# Patient Record
Sex: Female | Born: 1960 | Race: White | Hispanic: No | Marital: Married | Smoking: Never smoker
Health system: Southern US, Community
[De-identification: ages and names within clinical notes are randomized; demographics above are authoritative.]

---

## 1998-01-08 ENCOUNTER — Inpatient Hospital Stay (HOSPITAL_COMMUNITY): Admission: AD | Admit: 1998-01-08 | Discharge: 1998-01-12 | Payer: Self-pay | Admitting: Obstetrics & Gynecology

## 2000-02-10 ENCOUNTER — Other Ambulatory Visit: Admission: RE | Admit: 2000-02-10 | Discharge: 2000-02-10 | Payer: Self-pay | Admitting: Obstetrics & Gynecology

## 2003-06-19 ENCOUNTER — Other Ambulatory Visit: Admission: RE | Admit: 2003-06-19 | Discharge: 2003-06-19 | Payer: Self-pay | Admitting: Obstetrics and Gynecology

## 2008-11-26 ENCOUNTER — Encounter: Admission: RE | Admit: 2008-11-26 | Discharge: 2008-11-26 | Payer: Self-pay | Admitting: Obstetrics and Gynecology

## 2014-02-06 ENCOUNTER — Other Ambulatory Visit: Payer: Self-pay | Admitting: Obstetrics and Gynecology

## 2014-02-06 DIAGNOSIS — R928 Other abnormal and inconclusive findings on diagnostic imaging of breast: Secondary | ICD-10-CM

## 2014-02-15 ENCOUNTER — Ambulatory Visit
Admission: RE | Admit: 2014-02-15 | Discharge: 2014-02-15 | Disposition: A | Discharge status: Home / Self Care | Payer: PRIVATE HEALTH INSURANCE | Source: Ambulatory Visit | Attending: Obstetrics and Gynecology | Admitting: Obstetrics and Gynecology

## 2014-02-15 ENCOUNTER — Encounter (INDEPENDENT_AMBULATORY_CARE_PROVIDER_SITE_OTHER): Payer: Self-pay

## 2014-02-15 DIAGNOSIS — R928 Other abnormal and inconclusive findings on diagnostic imaging of breast: Secondary | ICD-10-CM

## 2021-04-08 ENCOUNTER — Encounter (INDEPENDENT_AMBULATORY_CARE_PROVIDER_SITE_OTHER): Payer: Self-pay | Admitting: Ophthalmology

## 2021-04-08 ENCOUNTER — Other Ambulatory Visit: Payer: Self-pay

## 2021-04-08 ENCOUNTER — Ambulatory Visit (INDEPENDENT_AMBULATORY_CARE_PROVIDER_SITE_OTHER): Payer: Self-pay | Admitting: Ophthalmology

## 2021-04-08 DIAGNOSIS — H35373 Puckering of macula, bilateral: Secondary | ICD-10-CM

## 2021-04-08 DIAGNOSIS — H25813 Combined forms of age-related cataract, bilateral: Secondary | ICD-10-CM

## 2021-04-08 DIAGNOSIS — H35342 Macular cyst, hole, or pseudohole, left eye: Secondary | ICD-10-CM

## 2021-04-08 DIAGNOSIS — H33322 Round hole, left eye: Secondary | ICD-10-CM

## 2021-04-08 DIAGNOSIS — H3581 Retinal edema: Secondary | ICD-10-CM

## 2021-04-08 MED ORDER — PREDNISOLONE ACETATE 1 % OP SUSP: 1.0000 [drp] | Freq: Four times a day (QID) | OPHTHALMIC | 0 refills | Status: AC

## 2021-04-08 NOTE — Progress Notes (Addendum)
[facility] - Clinic Note  04/08/2021     CHIEF COMPLAINT Patient presents for Retina Evaluation   HISTORY OF PRESENT ILLNESS: Lynn Gibbs is a 60 y.o. female who presents to the clinic today for:   HPI     Retina Evaluation   In left eye.  I, the attending physician,  performed the HPI with the patient and updated documentation appropriately.        Comments   Retina eval per Dr. Ronney Asters for retinal hole OS.  Patient had her yearly exam yesterday when this was noticed for the first time.  Patient denies new floaters (hx only a few that comes and goes), FOLs, vision appears stable.  She is myopic.  Her CLs Rx is -3.00 OD -2.50 OS.      Last edited by Rennis Chris, MD on 04/08/2021  4:15 PM.    Pt is here on the referral of Dr. Lorin Picket for concern of retinal hole OS, pt states he went to see him for a routine eye exam, she is not having any problems with her vision, no fol or floaters, pt saw Dr. Grayling Congress in January for an ERM in her right eye, pt denies being hypertensive or diabetic, she does not take any daily medications  Referring physician: Fredrich Birks, OD [address] [place],  [place] [address]  HISTORICAL INFORMATION:   Selected notes from the MEDICAL RECORD NUMBER Referred by Dr. Fredrich Birks for concern of retinal hole OS LEE:  Ocular Hx- PMH-    CURRENT MEDICATIONS: Current Outpatient Medications (Ophthalmic Drugs)  Medication Sig   prednisoLONE acetate (PRED FORTE) 1 % ophthalmic suspension Place 1 drop into the left eye 4 (four) times daily for 7 days.   No current facility-administered medications for this visit. (Ophthalmic Drugs)   No current outpatient medications on file. (Other)   No current facility-administered medications for this visit. (Other)   REVIEW OF SYSTEMS: ROS   Positive for: Eyes Negative for: Constitutional, Gastrointestinal, Neurological, Skin, Genitourinary, Musculoskeletal, HENT, Endocrine,  Cardiovascular, Respiratory, Psychiatric, Allergic/Imm, Heme/Lymph Last edited by Joni Reining, COA on 04/08/2021  1:44 PM.     ALLERGIES No Known Allergies  PAST MEDICAL HISTORY History reviewed. No pertinent past medical history. History reviewed. No pertinent surgical history.  FAMILY HISTORY Family History  Problem Relation Age of Onset   Amblyopia Neg Hx    Blindness Neg Hx    Cancer Neg Hx    Cataracts Neg Hx    Diabetes Neg Hx    Glaucoma Neg Hx    Hypertension Neg Hx    Macular degeneration Neg Hx    Retinal detachment Neg Hx    Strabismus Neg Hx    Stroke Neg Hx    Thyroid disease Neg Hx    Retinitis pigmentosa Neg Hx     SOCIAL HISTORY Social History   Tobacco Use   Smoking status: Never   Smokeless tobacco: Never       OPHTHALMIC EXAM: Base Eye Exam     Visual Acuity (Snellen - Linear)       Right Left   Dist cc 20/30 20/20 -2   Dist ph cc NI     Correction: Contacts         Tonometry (Tonopen, 2:00 PM)       Right Left   Pressure 18 18         Pupils       Dark Light Shape React  APD   Right 4 3 Round Brisk None   Left 4 3 Round Brisk None         Visual Fields (Counting fingers)       Left Right    Full Full         Extraocular Movement       Right Left    Full Full         Neuro/Psych     Oriented x3: Yes   Mood/Affect: Normal         Dilation     Both eyes: 1.0% Mydriacyl, 2.5% Phenylephrine @ 2:00 PM           Slit Lamp and Fundus Exam     External Exam       Right Left   External Normal Normal         Slit Lamp Exam       Right Left   Lids/Lashes Dermatochalasis - upper lid Dermatochalasis - upper lid   Conjunctiva/Sclera White and quiet White and quiet   Cornea arcus, 1+ fine Punctate epithelial erosions arcus, 1+ fine Punctate epithelial erosions   Anterior Chamber Deep and quiet Deep and quiet   Iris Round and dilated Round and dilated   Lens 2+ Nuclear sclerosis, 2+ Cortical  cataract 2+ Nuclear sclerosis, 2+ Cortical cataract   Vitreous Vitreous syneresis, Posterior vitreous detachment, mild vitreous condensations Vitreous syneresis, Posterior vitreous detachment         Fundus Exam       Right Left   Disc Pink and Sharp, Compact, mild tilt Pink and Sharp, Compact   C/D Ratio 0.1 0.2   Macula Flat, Blunted foveal reflex, ERM with central thickening and mild striae Flat, Blunted foveal reflex, ERM with central cystic changes   Vessels mild attenuation, mild tortuousity mild attenuation, mild tortuousity   Periphery Attached, No RT/RD Attached, operculated hole at 0200 with mild pigment surrounding, no SRF           Refraction     Wearing Rx       Sphere Cylinder Axis   Right -4.25 +1.00 005   Left -4.00 +1.50 175    Type: SVL         Manifest Refraction       Sphere Cylinder Axis Dist VA   Right -4.50 +1.00 020 NI   Left                IMAGING AND PROCEDURES  Imaging and Procedures for 04/08/2021  OCT, Retina - OU - Both Eyes       Right Eye Quality was good. Central Foveal Thickness: 478. Progression has no prior data. Findings include abnormal foveal contour, no IRF, no SRF, epiretinal membrane, myopic contour.   Left Eye Quality was good. Central Foveal Thickness: 375. Progression has no prior data. Findings include abnormal foveal contour, no SRF, intraretinal fluid, epiretinal membrane (ERM w/ central cystic changes and lamellar hole; flat retinal hole ST periphery -- caught on widefield).   Notes *Images captured and stored on drive  Diagnosis / Impression:  ERM OU OD: central thickening OS: ERM w/ central cystic changes and lamellar hole; flat retinal hole ST periphery -- caught on widefield  Clinical management:  See below  Abbreviations: NFP - Normal foveal profile. CME - cystoid macular edema. PED - pigment epithelial detachment. IRF - intraretinal fluid. SRF - subretinal fluid. EZ - ellipsoid zone. ERM -  epiretinal membrane. ORA - outer retinal atrophy. ORT -  outer retinal tubulation. SRHM - subretinal hyper-reflective material. IRHM - intraretinal hyper-reflective material      Repair Retinal Breaks, Laser - OS - Left Eye       LASER PROCEDURE NOTE  Procedure:  Barrier laser retinopexy using slit lamp laser, LEFT eye   Diagnosis:   Operculated retinal hole, LEFT eye                     0200 o'clock anterior to equator   Surgeon: Rennis Chris, MD, PhD  Anesthesia: Topical  Informed consent obtained, operative eye marked, and time out performed prior to initiation of laser.   Laser settings:  Lumenis Smart532 laser, slit lamp Lens: [place] PRP 165 Power: 300 mW Spot size: 200 microns Duration: 30 msec  # spots: 234  Placement of laser: Using a [place] PRP 165 contact lens at the slit lamp, laser was placed in three confluent rows around operculated hole at 2 oclock anterior to equator.  Complications: None.  Patient tolerated the procedure well and received written and verbal post-procedure care information/education.            ASSESSMENT/PLAN:    ICD-10-CM   1. Retinal hole of left eye  H33.322 Repair Retinal Breaks, Laser - OS - Left Eye    2. Epiretinal membrane (ERM) of both eyes  H35.373     3. Retinal edema  H35.81 OCT, Retina - OU - Both Eyes    4. Lamellar macular hole of left eye  H35.342     5. Combined forms of age-related cataract of both eyes  H25.813      1. Operculated retinal hole, OS   - The incidence, risk factors, and natural history of retinal tear was discussed with patient.   - Potential treatment options including laser retinopexy and cryotherapy discussed with patient. - operculated retinal hole located at 0200 -- no SRF, mild pigment surrounding - recommend laser retinopexy OS today, 11.02.22 - pt wishes to proceed with laser - RBA of procedure discussed, questions answered - informed consent obtained and signed - see procedure  note - start PF QID OS x7 days - f/u in 2-3 wks, DFE, OCT  2-4. Epiretinal membrane, both eyes   - OD w/ central retinal thickening  - OS w/ central cystic changes and lamellar macular hole - The natural history, anatomy, potential for loss of vision, and treatment options including vitrectomy techniques and the complications of endophthalmitis, retinal detachment, vitreous hemorrhage, cataract progression and permanent vision loss discussed with the patient. - BCVA 20/30 OD, 20/20 OS - asymptomatic, no metamorphopsia - no indication for surgery at this time - monitor for now - f/u 3 mos -- DFE/OCT  5. Mixed Cataract OU - The symptoms of cataract, surgical options, and treatments and risks were discussed with patient. - discussed diagnosis and progression - monitor   Ophthalmic Meds Ordered this visit:  Meds ordered this encounter  Medications   prednisoLONE acetate (PRED FORTE) 1 % ophthalmic suspension    Sig: Place 1 drop into the left eye 4 (four) times daily for 7 days.    Dispense:  10 mL    Refill:  0     Return for f/u 2-3 weeks, retinal tear OS, DFE, OCT.  There are no Patient Instructions on file for this visit.   Explained the diagnoses, plan, and follow up with the patient and they expressed understanding.  Patient expressed understanding of the importance of proper follow up care.  This document serves as a record of services personally performed by Karie Chimera, MD, PhD. It was created on their behalf by Glee Arvin. Manson Passey, OA an ophthalmic technician. The creation of this record is the provider's dictation and/or activities during the visit.    Electronically signed by: Glee Arvin. Manson Passey, [place] 11.02.2022 4:30 PM  Karie Chimera, M.D., Ph.D. Diseases & Surgery of the Retina and Vitreous [facility]  I have reviewed the above documentation for accuracy and completeness, and I agree with the above. Karie Chimera, M.D., Ph.D. 04/08/21 4:30  PM   Abbreviations: M myopia (nearsighted); A astigmatism; H hyperopia (farsighted); P presbyopia; Mrx spectacle prescription;  CTL contact lenses; OD right eye; OS left eye; OU both eyes  XT exotropia; ET esotropia; PEK punctate epithelial keratitis; PEE punctate epithelial erosions; DES dry eye syndrome; MGD meibomian gland dysfunction; ATs artificial tears; PFAT's preservative free artificial tears; NSC nuclear sclerotic cataract; PSC posterior subcapsular cataract; ERM epi-retinal membrane; PVD posterior vitreous detachment; RD retinal detachment; DM diabetes mellitus; DR diabetic retinopathy; NPDR non-proliferative diabetic retinopathy; PDR proliferative diabetic retinopathy; CSME clinically significant macular edema; DME diabetic macular edema; dbh dot blot hemorrhages; CWS cotton wool spot; POAG primary open angle glaucoma; C/D cup-to-disc ratio; HVF humphrey visual field; GVF goldmann visual field; OCT optical coherence tomography; IOP intraocular pressure; BRVO Branch retinal vein occlusion; CRVO central retinal vein occlusion; CRAO central retinal artery occlusion; BRAO branch retinal artery occlusion; RT retinal tear; SB scleral buckle; PPV pars plana vitrectomy; VH Vitreous hemorrhage; PRP panretinal laser photocoagulation; IVK intravitreal kenalog; VMT vitreomacular traction; MH Macular hole;  NVD neovascularization of the disc; NVE neovascularization elsewhere; AREDS age related eye disease study; ARMD age related macular degeneration; POAG primary open angle glaucoma; EBMD epithelial/anterior basement membrane dystrophy; ACIOL anterior chamber intraocular lens; IOL intraocular lens; PCIOL posterior chamber intraocular lens; Phaco/IOL phacoemulsification with intraocular lens placement; PRK photorefractive keratectomy; LASIK laser assisted in situ keratomileusis; HTN hypertension; DM diabetes mellitus; COPD chronic obstructive pulmonary disease

## 2021-04-21 NOTE — Progress Notes (Signed)
[facility] Clinic Note  04/24/2021     CHIEF COMPLAINT Patient presents for Retina Follow Up   HISTORY OF PRESENT ILLNESS: Lynn Gibbs is a 60 y.o. female who presents to the clinic today for:   HPI     Retina Follow Up   Patient presents with  Other.  In left eye.  Severity is mild.  Duration of 2.5 weeks.  Since onset it is stable.  I, the attending physician,  performed the HPI with the patient and updated documentation appropriately.        Comments   Pt here for 2 1/2 wk ret f/u for ret hole OS. S/P retinopexy for operculated hole OS. Pt states she is doing wonderfully. No changes in vision, floaters or FOL reported.       Last edited by Bernarda Caffey, MD on 04/25/2021 12:33 AM.     Pt states no problems after laser procedure, no new fol or floaters  Referring physician: Michael Boston, MD [place],  [place] [address]  HISTORICAL INFORMATION:   Selected notes from the MEDICAL RECORD NUMBER Referred by Dr. Macarthur Critchley for concern of retinal hole OS LEE:  Ocular Hx- PMH-    CURRENT MEDICATIONS: No current outpatient medications on file. (Ophthalmic Drugs)   No current facility-administered medications for this visit. (Ophthalmic Drugs)   No current outpatient medications on file. (Other)   No current facility-administered medications for this visit. (Other)   REVIEW OF SYSTEMS: ROS   Positive for: Eyes Negative for: Constitutional, Gastrointestinal, Neurological, Skin, Genitourinary, Musculoskeletal, HENT, Endocrine, Cardiovascular, Respiratory, Psychiatric, Allergic/Imm, Heme/Lymph Last edited by Kingsley Spittle, COT on 04/24/2021  9:41 AM.      ALLERGIES No Known Allergies  PAST MEDICAL HISTORY History reviewed. No pertinent past medical history. History reviewed. No pertinent surgical history.  FAMILY HISTORY Family History  Problem Relation Age of Onset   Amblyopia Neg Hx    Blindness Neg Hx     Cancer Neg Hx    Cataracts Neg Hx    Diabetes Neg Hx    Glaucoma Neg Hx    Hypertension Neg Hx    Macular degeneration Neg Hx    Retinal detachment Neg Hx    Strabismus Neg Hx    Stroke Neg Hx    Thyroid disease Neg Hx    Retinitis pigmentosa Neg Hx     SOCIAL HISTORY Social History   Tobacco Use   Smoking status: Never   Smokeless tobacco: Never  Substance Use Topics   Alcohol use: Not Currently       OPHTHALMIC EXAM: Base Eye Exam     Visual Acuity (Snellen - Linear)       Right Left   Dist cc 20/30 +2 20/20   Dist ph cc 20/25 -1     Correction: Contacts         Tonometry (Tonopen, 9:50 AM)       Right Left   Pressure 15 15         Pupils       Dark Light Shape React APD   Right 3 2 [place] Brisk None   Left 3 2 [place] Brisk None         Visual Fields       Left Right    Full Full         Extraocular Movement       Right Left    Full, Ortho Full,  Ortho         Neuro/Psych     Oriented x3: Yes   Mood/Affect: Normal           Slit Lamp and Fundus Exam     External Exam       Right Left   External Normal Normal         Slit Lamp Exam       Right Left   Lids/Lashes Dermatochalasis - upper lid Dermatochalasis - upper lid   Conjunctiva/Sclera White and quiet White and quiet   Cornea arcus, 1+ fine Punctate epithelial erosions arcus, 1+ fine Punctate epithelial erosions   Anterior Chamber Deep and quiet Deep and quiet   Iris Round and dilated Round and dilated   Lens 2+ Nuclear sclerosis, 2+ Cortical cataract 2+ Nuclear sclerosis, 2+ Cortical cataract   Anterior Vitreous Vitreous syneresis, Posterior vitreous detachment, mild vitreous condensations Vitreous syneresis, Posterior vitreous detachment         Fundus Exam       Right Left   Disc Pink and Sharp, Compact, mild tilt Pink and Sharp, Compact   C/D Ratio 0.1 0.2   Macula Flat, Blunted foveal reflex, ERM with central thickening and mild striae Flat, Blunted  foveal reflex, ERM with central cystic changes   Vessels mild attenuation, mild tortuousity mild attenuation, mild tortuousity   Periphery Attached, No RT/RD Attached, operculated hole at 0200 with mild pigment surrounding -- good laser changes surrounding, no new RT/RD           Refraction     Wearing Rx       Sphere   Right -3.00   Left -2.50            IMAGING AND PROCEDURES  Imaging and Procedures for 04/24/2021  OCT, Retina - OU - Both Eyes       Right Eye Quality was good. Central Foveal Thickness: 482. Progression has been stable. Findings include abnormal foveal contour, no IRF, no SRF, epiretinal membrane, myopic contour (ERM with central retinal thickening and mild pucker).   Left Eye Quality was good. Central Foveal Thickness: 393. Progression has been stable. Findings include abnormal foveal contour, no SRF, intraretinal fluid, epiretinal membrane (ERM w/ central cystic changes and lamellar hole; flat retinal hole ST periphery -- caught on widefield -- not imaged today).   Notes *Images captured and stored on drive  Diagnosis / Impression:  ERM OU OD: ERM w/ central thickening and mild pucker OS: ERM w/ central cystic changes and lamellar hole; flat retinal hole ST periphery -- caught on widefield - not imaged today  Clinical management:  See below  Abbreviations: NFP - Normal foveal profile. CME - cystoid macular edema. PED - pigment epithelial detachment. IRF - intraretinal fluid. SRF - subretinal fluid. EZ - ellipsoid zone. ERM - epiretinal membrane. ORA - outer retinal atrophy. ORT - outer retinal tubulation. SRHM - subretinal hyper-reflective material. IRHM - intraretinal hyper-reflective material             ASSESSMENT/PLAN:    ICD-10-CM   1. Retinal hole of left eye  H33.322     2. Epiretinal membrane (ERM) of both eyes  H35.373     3. Retinal edema  H35.81 OCT, Retina - OU - Both Eyes    4. Lamellar macular hole of left eye  H35.342      5. Combined forms of age-related cataract of both eyes  H25.813       1. Operculated retinal  hole, OS   - operculated retinal hole located at 0200 -- no SRF, mild pigment surrounding - s/p laser retinopexy OS (11.02.22) -- excellent laser in place surrounding hole - f/u in 3 months, DFE, OCT  2-4. Epiretinal membrane, both eyes   - OD w/ central retinal thickening  - OS w/ central cystic changes and lamellar macular hole - BCVA 20/25 OD, 20/20 OS - asymptomatic, no metamorphopsia - no indication for surgery at this time - monitor for now - f/u 3 mos -- DFE/OCT  5. Mixed Cataract OU - The symptoms of cataract, surgical options, and treatments and risks were discussed with patient. - discussed diagnosis and progression - monitor    Ophthalmic Meds Ordered this visit:  No orders of the defined types were placed in this encounter.    Return in about 3 months (around 07/25/2021) for f/u retinal hole OS / ERM OU, DFE, OCT.  There are no Patient Instructions on file for this visit.   Explained the diagnoses, plan, and follow up with the patient and they expressed understanding.  Patient expressed understanding of the importance of proper follow up care.   This document serves as a record of services personally performed by Gardiner Sleeper, MD, PhD. It was created on their behalf by Leonie Douglas, an ophthalmic technician. The creation of this record is the provider's dictation and/or activities during the visit.    Electronically signed by: Leonie Douglas COA, 04/25/21  12:47 AM  This document serves as a record of services personally performed by Gardiner Sleeper, MD, PhD. It was created on their behalf by San Jetty. Owens Shark, OA an ophthalmic technician. The creation of this record is the provider's dictation and/or activities during the visit.    Electronically signed by: San Jetty. Marguerita Merles 11.18.2022 12:47 AM  Gardiner Sleeper, M.D., Ph.D. Diseases & Surgery of the Retina and  [place] 04/24/2021  I have reviewed the above documentation for accuracy and completeness, and I agree with the above. Gardiner Sleeper, M.D., Ph.D. 04/25/21 12:47 AM  Abbreviations: M myopia (nearsighted); A astigmatism; H hyperopia (farsighted); P presbyopia; Mrx spectacle prescription;  CTL contact lenses; OD right eye; OS left eye; OU both eyes  XT exotropia; ET esotropia; PEK punctate epithelial keratitis; PEE punctate epithelial erosions; DES dry eye syndrome; MGD meibomian gland dysfunction; ATs artificial tears; PFAT's preservative free artificial tears; [place] nuclear sclerotic cataract; PSC posterior subcapsular cataract; ERM epi-retinal membrane; PVD posterior vitreous detachment; RD retinal detachment; DM diabetes mellitus; DR diabetic retinopathy; NPDR non-proliferative diabetic retinopathy; PDR proliferative diabetic retinopathy; CSME clinically significant macular edema; DME diabetic macular edema; dbh dot blot hemorrhages; CWS cotton wool spot; POAG primary open angle glaucoma; C/D cup-to-disc ratio; HVF humphrey visual field; GVF goldmann visual field; OCT optical coherence tomography; IOP intraocular pressure; BRVO Branch retinal vein occlusion; CRVO central retinal vein occlusion; CRAO central retinal artery occlusion; BRAO branch retinal artery occlusion; RT retinal tear; SB scleral buckle; PPV pars plana vitrectomy; VH Vitreous hemorrhage; PRP panretinal laser photocoagulation; IVK intravitreal kenalog; VMT vitreomacular traction; MH Macular hole;  NVD neovascularization of the disc; NVE neovascularization elsewhere; AREDS age related eye disease study; ARMD age related macular degeneration; POAG primary open angle glaucoma; EBMD epithelial/anterior basement membrane dystrophy; ACIOL anterior chamber intraocular lens; IOL intraocular lens; PCIOL posterior chamber intraocular lens; Phaco/IOL phacoemulsification with intraocular lens placement; [place]  photorefractive keratectomy; LASIK laser assisted in situ keratomileusis; HTN hypertension; DM diabetes mellitus; COPD chronic obstructive  pulmonary disease

## 2021-04-24 ENCOUNTER — Ambulatory Visit (INDEPENDENT_AMBULATORY_CARE_PROVIDER_SITE_OTHER): Payer: Self-pay | Admitting: Ophthalmology

## 2021-04-24 ENCOUNTER — Other Ambulatory Visit: Payer: Self-pay

## 2021-04-24 ENCOUNTER — Encounter (INDEPENDENT_AMBULATORY_CARE_PROVIDER_SITE_OTHER): Payer: Self-pay | Admitting: Ophthalmology

## 2021-04-24 DIAGNOSIS — H3581 Retinal edema: Secondary | ICD-10-CM

## 2021-04-24 DIAGNOSIS — H35373 Puckering of macula, bilateral: Secondary | ICD-10-CM

## 2021-04-24 DIAGNOSIS — H35342 Macular cyst, hole, or pseudohole, left eye: Secondary | ICD-10-CM

## 2021-04-24 DIAGNOSIS — H33322 Round hole, left eye: Secondary | ICD-10-CM | POA: Diagnosis not present

## 2021-04-24 DIAGNOSIS — H25813 Combined forms of age-related cataract, bilateral: Secondary | ICD-10-CM | POA: Diagnosis not present

## 2021-04-25 ENCOUNTER — Encounter (INDEPENDENT_AMBULATORY_CARE_PROVIDER_SITE_OTHER): Payer: Self-pay | Admitting: Ophthalmology

## 2021-04-29 ENCOUNTER — Encounter (INDEPENDENT_AMBULATORY_CARE_PROVIDER_SITE_OTHER): Payer: PRIVATE HEALTH INSURANCE | Admitting: Ophthalmology

## 2021-07-27 ENCOUNTER — Encounter (INDEPENDENT_AMBULATORY_CARE_PROVIDER_SITE_OTHER): Payer: PRIVATE HEALTH INSURANCE | Admitting: Ophthalmology

## 2021-08-21 NOTE — Progress Notes (Signed)
?[facility] - Clinic Note ? ?08/25/2021 ? ?  ? ?CHIEF COMPLAINT ?Patient presents for Retina Follow Up ? ?HISTORY OF PRESENT ILLNESS: ?Lynn Gibbs is a 61 y.o. female who presents to the clinic today for:  ? ?HPI   ? ? Retina Follow Up   ?Patient presents with  Other.  In both eyes.  Duration of 4 months.  Since onset it is stable.  I, the attending physician,  performed the HPI with the patient and updated documentation appropriately. ? ?  ?  ? ? Comments   ?4 1/2 month follow up ERM OU- Doing well, no changes or new problems.  ? ?  ?  ?Last edited by Rennis Chris, MD on 08/26/2021 10:05 PM.  ?  ?Pt states no problems after laser procedure, no new fol or floaters ? ?Referring physician: ?Melida Quitter, MD ?[address] ?[place],  [place] [address] ? ?HISTORICAL INFORMATION:  ? ?Selected notes from the MEDICAL RECORD NUMBER ?Referred by Dr. Fredrich Birks for concern of retinal hole OS ?LEE:  ?Ocular Hx- ?PMH- ?  ? ?CURRENT MEDICATIONS: ?No current outpatient medications on file. (Ophthalmic Drugs)  ? ?No current facility-administered medications for this visit. (Ophthalmic Drugs)  ? ?No current outpatient medications on file. (Other)  ? ?No current facility-administered medications for this visit. (Other)  ? ?REVIEW OF SYSTEMS: ?ROS   ?Positive for: Eyes ?Negative for: Constitutional, Gastrointestinal, Neurological, Skin, Genitourinary, Musculoskeletal, HENT, Endocrine, Cardiovascular, Respiratory, Psychiatric, Allergic/Imm, Heme/Lymph ?Last edited by Joni Reining, COA on 08/25/2021  9:37 AM.  ?  ? ?ALLERGIES ?No Known Allergies ? ?PAST MEDICAL HISTORY ?History reviewed. No pertinent past medical history. ?History reviewed. No pertinent surgical history. ? ?FAMILY HISTORY ?Family History  ?Problem Relation Age of Onset  ? Amblyopia Neg Hx   ? Blindness Neg Hx   ? Cancer Neg Hx   ? Cataracts Neg Hx   ? Diabetes Neg Hx   ? Glaucoma Neg Hx   ? Hypertension Neg Hx   ? Macular degeneration Neg Hx   ?  Retinal detachment Neg Hx   ? Strabismus Neg Hx   ? Stroke Neg Hx   ? Thyroid disease Neg Hx   ? Retinitis pigmentosa Neg Hx   ? ?SOCIAL HISTORY ?Social History  ? ?Tobacco Use  ? Smoking status: Never  ? Smokeless tobacco: Never  ?Substance Use Topics  ? Alcohol use: Not Currently  ?  ? ?  ?OPHTHALMIC EXAM: ?Base Eye Exam   ? ? Visual Acuity (Snellen - Linear)   ? ?   Right Left  ? Dist cc 20/30 +1 20/20  ? Dist ph cc NI   ? ? Correction: Glasses  ? ?  ?  ? ? Tonometry (Tonopen, 9:43 AM)   ? ?   Right Left  ? Pressure 20 20  ? ?  ?  ? ? Pupils   ? ?   Dark Light Shape React APD  ? Right 4 3 Round Brisk None  ? Left 4 3 Round Brisk None  ? ?  ?  ? ? Visual Fields (Counting fingers)   ? ?   Left Right  ?  Full Full  ? ?  ?  ? ? Extraocular Movement   ? ?   Right Left  ?  Full Full  ? ?  ?  ? ? Neuro/Psych   ? ? Oriented x3: Yes  ? Mood/Affect: Normal  ? ?  ?  ? ?  Dilation   ? ? Both eyes: 1.0% Mydriacyl, 2.5% Phenylephrine @ 9:43 AM  ? ?  ?  ? ?  ? ?Slit Lamp and Fundus Exam   ? ? External Exam   ? ?   Right Left  ? External Normal Normal  ? ?  ?  ? ? Slit Lamp Exam   ? ?   Right Left  ? Lids/Lashes Dermatochalasis - upper lid Dermatochalasis - upper lid  ? Conjunctiva/Sclera White and quiet White and quiet  ? Cornea arcus, 1+ fine Punctate epithelial erosions arcus, 1+ fine Punctate epithelial erosions  ? Anterior Chamber Deep and quiet Deep and quiet  ? Iris Round and dilated Round and dilated  ? Lens 2-3+ Nuclear sclerosis, 2-3+ Cortical cataract 2-3+ Nuclear sclerosis, 2-3+ Cortical cataract  ? Anterior Vitreous Vitreous syneresis, Posterior vitreous detachment, mild vitreous condensations Vitreous syneresis, Posterior vitreous detachment, Vitreous condensations  ? ?  ?  ? ? Fundus Exam   ? ?   Right Left  ? Disc Pink and Sharp, Compact, mild tilt Pink and Sharp, Compact  ? C/D Ratio 0.3 0.2  ? Macula Flat, Blunted foveal reflex, ERM with central thickening and mild striae, mild RPE mottling Flat, Blunted foveal  reflex, ERM with central cystic changes  ? Vessels mild attenuation, mild tortuousity mild attenuation, mild tortuousity  ? Periphery Attached, No heme, No RT/RD, mild focal Reticular degeneration IT arcade Attached, operculated hole at 0200 with mild pigment surrounding -- good laser changes surrounding, no new RT/RD  ? ?  ?  ? ?  ? ?Refraction   ? ? Wearing Rx   ? ?   Sphere Cylinder Axis  ? Right -4.25 +1.00 005  ? Left -4.00 +1.50 175  ? ? Type: SVL  ? ?  ?  ? ?  ? ?IMAGING AND PROCEDURES  ?Imaging and Procedures for 08/25/2021 ? ?OCT, Retina - OU - Both Eyes   ? ?   ?Right Eye ?Quality was good. Central Foveal Thickness: 478. Progression has been stable. Findings include abnormal foveal contour, no IRF, no SRF, epiretinal membrane, myopic contour (ERM with central retinal thickening and mild pucker -- stable).  ? ?Left Eye ?Quality was good. Central Foveal Thickness: 375. Progression has been stable. Findings include abnormal foveal contour, no SRF, intraretinal fluid, epiretinal membrane (ERM w/ central cystic changes and lamellar hole; flat retinal hole ST periphery -- caught on widefield -- stable with good laser surrounding).  ? ?Notes ?*Images captured and stored on drive ? ?Diagnosis / Impression:  ?ERM OU ?OD: ERM w/ central thickening and mild pucker ?OS: ERM w/ central cystic changes and lamellar hole; flat retinal hole ST periphery -- caught on widefield -- stable with good laser surrounding ? ?Clinical management:  ?See below ? ?Abbreviations: NFP - Normal foveal profile. CME - cystoid macular edema. PED - pigment epithelial detachment. IRF - intraretinal fluid. SRF - subretinal fluid. EZ - ellipsoid zone. ERM - epiretinal membrane. ORA - outer retinal atrophy. ORT - outer retinal tubulation. SRHM - subretinal hyper-reflective material. IRHM - intraretinal hyper-reflective material ? ? ?  ?  ?  ? ?  ?ASSESSMENT/PLAN: ? ?  ICD-10-CM   ?1. 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  ?2. Epiretinal membrane  (ERM) of both eyes  H35.373 OCT, Retina - OU - Both Eyes  ?  ?3. Lamellar macular hole of left eye  H35.342   ?  ?4. Combined forms of age-related cataract of both  eyes  H25.813   ?  ? ?1. Operculated retinal hole, OS   ?- operculated retinal hole located at 0200 -- no SRF, mild pigment surrounding ?- s/p laser retinopexy OS (11.02.22) -- excellent laser in place surrounding hole  ?- f/u in 6 months, DFE, OCT ? ?2,3. Epiretinal membrane, both eyes  ? - OD w/ central retinal thickening ? - OS w/ central cystic changes and lamellar macular hole ?- BCVA 20/25 OD, 20/20 OS ?- asymptomatic, no metamorphopsia ?- no indication for surgery at this time ?- monitor for now ?- f/u 6 mos -- DFE/OCT ? ?4. Mixed Cataract OU ?- The symptoms of cataract, surgical options, and treatments and risks were discussed with patient. ?- discussed diagnosis and progression ?- monitor  ? ?Ophthalmic Meds Ordered this visit:  ?No orders of the defined types were placed in this encounter. ?  ? ?Return in about 6 months (around 02/25/2022) for DFE, OCT. ? ?There are no Patient Instructions on file for this visit. ? ? ?Explained the diagnoses, plan, and follow up with the patient and they expressed understanding.  Patient expressed understanding of the importance of proper follow up care.  ? ?This document serves as a record of services personally performed by Karie Chimera, MD, PhD. It was created on their behalf by Joni Reining, an ophthalmic technician. The creation of this record is the provider's dictation and/or activities during the visit.   ? ?Electronically signed by: Joni Reining COA, 08/26/21  10:07 PM ? ?Karie Chimera, M.D., Ph.D. ?Diseases & Surgery of the Retina and Vitreous ?[facility] ? ?I have reviewed the above documentation for accuracy and completeness, and I agree with the above. Karie Chimera, M.D., Ph.D. 08/26/21 10:12 PM ? ?Abbreviations: ?M myopia (nearsighted); A astigmatism; H hyperopia  (farsighted); P presbyopia; Mrx spectacle prescription;  CTL contact lenses; OD right eye; OS left eye; OU both eyes  XT exotropia; ET esotropia; PEK punctate epithelial keratitis; PEE punctate epithelial eros

## 2021-08-25 ENCOUNTER — Other Ambulatory Visit: Payer: Self-pay

## 2021-08-25 ENCOUNTER — Encounter (INDEPENDENT_AMBULATORY_CARE_PROVIDER_SITE_OTHER): Payer: Self-pay | Admitting: Ophthalmology

## 2021-08-25 ENCOUNTER — Ambulatory Visit (INDEPENDENT_AMBULATORY_CARE_PROVIDER_SITE_OTHER): Payer: Self-pay | Admitting: Ophthalmology

## 2021-08-25 DIAGNOSIS — H35373 Puckering of macula, bilateral: Secondary | ICD-10-CM | POA: Diagnosis not present

## 2021-08-25 DIAGNOSIS — H33322 Round hole, left eye: Secondary | ICD-10-CM | POA: Diagnosis not present

## 2021-08-25 DIAGNOSIS — H35342 Macular cyst, hole, or pseudohole, left eye: Secondary | ICD-10-CM | POA: Diagnosis not present

## 2021-08-25 DIAGNOSIS — H25813 Combined forms of age-related cataract, bilateral: Secondary | ICD-10-CM

## 2021-08-26 ENCOUNTER — Encounter (INDEPENDENT_AMBULATORY_CARE_PROVIDER_SITE_OTHER): Payer: Self-pay | Admitting: Ophthalmology

## 2021-10-01 ENCOUNTER — Other Ambulatory Visit: Payer: Self-pay | Admitting: Internal Medicine

## 2021-10-01 DIAGNOSIS — M85859 Other specified disorders of bone density and structure, unspecified thigh: Secondary | ICD-10-CM

## 2021-10-05 ENCOUNTER — Other Ambulatory Visit: Payer: Self-pay | Admitting: Internal Medicine

## 2021-10-05 DIAGNOSIS — Z1231 Encounter for screening mammogram for malignant neoplasm of breast: Secondary | ICD-10-CM

## 2021-11-23 ENCOUNTER — Ambulatory Visit
Admission: RE | Admit: 2021-11-23 | Discharge: 2021-11-23 | Disposition: A | Discharge status: Home / Self Care | Payer: No Typology Code available for payment source | Source: Ambulatory Visit | Attending: Internal Medicine | Admitting: Internal Medicine

## 2021-11-23 ENCOUNTER — Ambulatory Visit
Admission: RE | Admit: 2021-11-23 | Discharge: 2021-11-23 | Disposition: A | Discharge status: Home / Self Care | Payer: Self-pay | Source: Ambulatory Visit | Attending: Internal Medicine | Admitting: Internal Medicine

## 2021-11-23 ENCOUNTER — Other Ambulatory Visit: Payer: Self-pay | Admitting: Internal Medicine

## 2021-11-23 DIAGNOSIS — Z1231 Encounter for screening mammogram for malignant neoplasm of breast: Secondary | ICD-10-CM

## 2021-11-23 DIAGNOSIS — M85859 Other specified disorders of bone density and structure, unspecified thigh: Secondary | ICD-10-CM

## 2021-11-25 ENCOUNTER — Other Ambulatory Visit: Payer: PRIVATE HEALTH INSURANCE

## 2022-02-18 NOTE — Progress Notes (Signed)
[facility] Clinic Note  02/22/2022     CHIEF COMPLAINT Patient presents for Retina Follow Up  HISTORY OF PRESENT ILLNESS: Lynn Gibbs is a 61 y.o. female who presents to the clinic today for:   HPI     Retina Follow Up   Patient presents with  Other.  In left eye.  This started 6 months ago.  I, the attending physician,  performed the HPI with the patient and updated documentation appropriately.        Comments   Patient here for 6 month retina follow up for retina hole OS. Patient states vision doing the same. No eye pain.      Last edited by Bernarda Caffey, MD on 02/22/2022 10:06 PM.     Pt states VA is stable.   Referring physician: Michael Boston, MD [place],  [place] [address]  HISTORICAL INFORMATION:   Selected notes from the MEDICAL RECORD NUMBER Referred by Dr. Macarthur Critchley for concern of retinal hole OS LEE:  Ocular Hx- PMH-    CURRENT MEDICATIONS: No current outpatient medications on file. (Ophthalmic Drugs)   No current facility-administered medications for this visit. (Ophthalmic Drugs)   No current outpatient medications on file. (Other)   No current facility-administered medications for this visit. (Other)   REVIEW OF SYSTEMS: ROS   Positive for: Eyes Negative for: Constitutional, Gastrointestinal, Neurological, Skin, Genitourinary, Musculoskeletal, HENT, Endocrine, Cardiovascular, Respiratory, Psychiatric, Allergic/Imm, Heme/Lymph Last edited by Theodore Demark, COA on 02/22/2022  9:54 AM.     ALLERGIES No Known Allergies  PAST MEDICAL HISTORY History reviewed. No pertinent past medical history. History reviewed. No pertinent surgical history.  FAMILY HISTORY Family History  Problem Relation Age of Onset   Amblyopia Neg Hx    Blindness Neg Hx    Cancer Neg Hx    Cataracts Neg Hx    Diabetes Neg Hx    Glaucoma Neg Hx    Hypertension Neg Hx    Macular degeneration Neg Hx    Retinal detachment Neg  Hx    Strabismus Neg Hx    Stroke Neg Hx    Thyroid disease Neg Hx    Retinitis pigmentosa Neg Hx    SOCIAL HISTORY Social History   Tobacco Use   Smoking status: Never   Smokeless tobacco: Never  Vaping Use   Vaping Use: Never used  Substance Use Topics   Alcohol use: Not Currently       OPHTHALMIC EXAM: Base Eye Exam     Visual Acuity (Snellen - Linear)       Right Left   Dist cc 20/30 -1 20/20   Dist ph cc 20/25 -2     Correction: Glasses         Tonometry (Tonopen, 9:51 AM)       Right Left   Pressure 16 16         Pupils       Dark Light Shape React APD   Right 4 3 Round Brisk None   Left 4 3 Round Brisk None         Visual Fields (Counting fingers)       Left Right    Full Full         Extraocular Movement       Right Left    Full, Ortho Full, Ortho         Neuro/Psych     Oriented x3: Yes  Mood/Affect: Normal         Dilation     Both eyes: 1.0% Mydriacyl, 2.5% Phenylephrine @ 9:49 AM           Slit Lamp and Fundus Exam     External Exam       Right Left   External Normal Normal         Slit Lamp Exam       Right Left   Lids/Lashes Dermatochalasis - upper lid Dermatochalasis - upper lid   Conjunctiva/Sclera White and quiet White and quiet   Cornea arcus, 2-3+ fine Punctate epithelial erosions arcus, 1-2+ fine Punctate epithelial erosions   Anterior Chamber Deep and quiet Deep and quiet   Iris Round and dilated Round and dilated   Lens 2-3+ Nuclear sclerosis, 2+ Cortical cataract 2-3+ Nuclear sclerosis, 2+ Cortical cataract, trace PSC   Anterior Vitreous Vitreous syneresis, Posterior vitreous detachment, mild vitreous condensations Vitreous syneresis, Posterior vitreous detachment, Vitreous condensations         Fundus Exam       Right Left   Disc Pink and Sharp, Compact, mild tilt, temporal PPA Pink and Sharp, Compact, mild temporal PPA   C/D Ratio 0.3 0.2   Macula Flat, Blunted foveal reflex, ERM  with central thickening and mild striae, mild RPE mottling Flat, Blunted foveal reflex, ERM with central cystic changes   Vessels mild attenuation, mild tortuousity mild attenuation, mild tortuousity   Periphery Attached, No heme, No RT/RD, mild Reticular degeneration Attached, operculated hole at 0200 with mild pigment surrounding -- good laser changes surrounding, mild inferior pavingstone, scattered mild reticular degeneration, no new RT/RD           Refraction     Wearing Rx       Sphere Cylinder Axis   Right -4.25 +1.00 005   Left -4.00 +1.50 175    Type: SVL           IMAGING AND PROCEDURES  Imaging and Procedures for 02/22/2022  OCT, Retina - OU - Both Eyes       Right Eye Quality was good. Central Foveal Thickness: 503. Progression has worsened. Findings include no IRF, no SRF, abnormal foveal contour, myopic contour, epiretinal membrane (ERM with mild pucker and mild interval increase in central retinal thickening/blunting of foveal contour).   Left Eye Quality was good. Central Foveal Thickness: 380. Progression has been stable. Findings include no SRF, abnormal foveal contour, epiretinal membrane, intraretinal fluid (ERM w/ central cystic changes and lamellar hole).   Notes *Images captured and stored on drive  Diagnosis / Impression:  ERM OU OD: ERM with mild pucker and mild interval increase in central retinal thickening/blunting of foveal contour OS: ERM w/ central cystic changes and lamellar hole  Clinical management:  See below  Abbreviations: NFP - Normal foveal profile. CME - cystoid macular edema. PED - pigment epithelial detachment. IRF - intraretinal fluid. SRF - subretinal fluid. EZ - ellipsoid zone. ERM - epiretinal membrane. ORA - outer retinal atrophy. ORT - outer retinal tubulation. SRHM - subretinal hyper-reflective material. IRHM - intraretinal hyper-reflective material            ASSESSMENT/PLAN:    ICD-10-CM   1. Retinal hole of  left eye  H33.322     2. Epiretinal membrane (ERM) of both eyes  H35.373 OCT, Retina - OU - Both Eyes    3. Lamellar macular hole of left eye  H35.342 OCT, Retina - OU - Both Eyes  4. Combined forms of age-related cataract of both eyes  H25.813       1. Operculated retinal hole, OS   - operculated retinal hole located at 0200 -- no SRF, mild pigment surrounding - s/p laser retinopexy OS (11.02.22) -- excellent laser in place surrounding hole  - no new RT/RD - f/u in 6 months, DFE, OCT  2,3. Epiretinal membrane, both eyes   - OD w/ central retinal thickening  - OS w/ central cystic changes and lamellar macular hole - BCVA 20/25 OD, 20/20 OS -- stable - asymptomatic, no metamorphopsia - no indication for surgery at this time - monitor for now - f/u 6 months DFE, OCT  4. Mixed Cataract OU - The symptoms of cataract, surgical options, and treatments and risks were discussed with patient. - discussed diagnosis and progression - monitor  Ophthalmic Meds Ordered this visit:  No orders of the defined types were placed in this encounter.    Return in about 6 months (around 08/23/2022) for ERM OD, ret hole OS, DFE, OCT .  There are no Patient Instructions on file for this visit.   Explained the diagnoses, plan, and follow up with the patient and they expressed understanding.  Patient expressed understanding of the importance of proper follow up care.   This document serves as a record of services personally performed by Gardiner Sleeper, MD, PhD. It was created on their behalf by Roselee Nova, COMT. The creation of this record is the provider's dictation and/or activities during the visit.  Electronically signed by: Roselee Nova, COMT 02/22/22 10:08 PM  This document serves as a record of services personally performed by Gardiner Sleeper, MD, PhD. It was created on their behalf by Orvan Falconer, an ophthalmic technician. The creation of this record is the provider's dictation  and/or activities during the visit.    Electronically signed by: Orvan Falconer, OA, 02/22/22  10:08 PM  Gardiner Sleeper, M.D., Ph.D. Diseases & Surgery of the Retina and Vitreous [facility]  I have reviewed the above documentation for accuracy and completeness, and I agree with the above. Gardiner Sleeper, M.D., Ph.D. 02/22/22 10:10 PM   Abbreviations: M myopia (nearsighted); A astigmatism; H hyperopia (farsighted); P presbyopia; Mrx spectacle prescription;  CTL contact lenses; OD right eye; OS left eye; OU both eyes  XT exotropia; ET esotropia; PEK punctate epithelial keratitis; PEE punctate epithelial erosions; DES dry eye syndrome; MGD meibomian gland dysfunction; ATs artificial tears; PFAT's preservative free artificial tears; [place] nuclear sclerotic cataract; PSC posterior subcapsular cataract; ERM epi-retinal membrane; PVD posterior vitreous detachment; RD retinal detachment; DM diabetes mellitus; DR diabetic retinopathy; NPDR non-proliferative diabetic retinopathy; PDR proliferative diabetic retinopathy; CSME clinically significant macular edema; DME diabetic macular edema; dbh dot blot hemorrhages; CWS cotton wool spot; POAG primary open angle glaucoma; C/D cup-to-disc ratio; HVF humphrey visual field; GVF goldmann visual field; OCT optical coherence tomography; IOP intraocular pressure; BRVO Branch retinal vein occlusion; CRVO central retinal vein occlusion; CRAO central retinal artery occlusion; BRAO branch retinal artery occlusion; RT retinal tear; SB scleral buckle; PPV pars plana vitrectomy; VH Vitreous hemorrhage; PRP panretinal laser photocoagulation; IVK intravitreal kenalog; VMT vitreomacular traction; MH Macular hole;  NVD neovascularization of the disc; NVE neovascularization elsewhere; AREDS age related eye disease study; ARMD age related macular degeneration; POAG primary open angle glaucoma; EBMD epithelial/anterior basement membrane dystrophy; ACIOL anterior  chamber intraocular lens; IOL intraocular lens; PCIOL posterior chamber intraocular lens; Phaco/IOL phacoemulsification with intraocular lens placement;  [place] photorefractive keratectomy; LASIK laser assisted in situ keratomileusis; HTN hypertension; DM diabetes mellitus; COPD chronic obstructive pulmonary disease

## 2022-02-22 ENCOUNTER — Encounter (INDEPENDENT_AMBULATORY_CARE_PROVIDER_SITE_OTHER): Payer: Self-pay | Admitting: Ophthalmology

## 2022-02-22 ENCOUNTER — Ambulatory Visit (INDEPENDENT_AMBULATORY_CARE_PROVIDER_SITE_OTHER): Payer: Self-pay | Admitting: Ophthalmology

## 2022-02-22 DIAGNOSIS — H25813 Combined forms of age-related cataract, bilateral: Secondary | ICD-10-CM

## 2022-02-22 DIAGNOSIS — H35373 Puckering of macula, bilateral: Secondary | ICD-10-CM

## 2022-02-22 DIAGNOSIS — H35342 Macular cyst, hole, or pseudohole, left eye: Secondary | ICD-10-CM

## 2022-02-22 DIAGNOSIS — H33322 Round hole, left eye: Secondary | ICD-10-CM

## 2022-08-11 NOTE — Progress Notes (Signed)
[facility]  08/23/2022     CHIEF COMPLAINT Patient presents for Retina Follow Up  HISTORY OF PRESENT ILLNESS: Lynn Gibbs is a 62 y.o. female who presents to the clinic today for:   HPI     Retina Follow Up   Patient presents with  Other.  In left eye.  This started years ago.  Duration of 6 months.  Since onset it is stable.  I, the attending physician,  performed the HPI with the patient and updated documentation appropriately.        Comments   Patient feels that the vision is the same since her last visit 6 months ago. She is not using any eye drops at this time.       Last edited by Bernarda Caffey, MD on 08/25/2022  1:07 AM.     Patient feels that the vision is the same.   Referring physician: Michael Boston, MD [place],  [place] [address]  HISTORICAL INFORMATION:   Selected notes from the MEDICAL RECORD NUMBER Referred by Dr. Macarthur Critchley for concern of retinal hole OS LEE:  Ocular Hx- PMH-    CURRENT MEDICATIONS: No current outpatient medications on file. (Ophthalmic Drugs)   No current facility-administered medications for this visit. (Ophthalmic Drugs)   No current outpatient medications on file. (Other)   No current facility-administered medications for this visit. (Other)   REVIEW OF SYSTEMS: ROS   Positive for: Eyes Negative for: Constitutional, Gastrointestinal, Neurological, Skin, Genitourinary, Musculoskeletal, HENT, Endocrine, Cardiovascular, Respiratory, Psychiatric, Allergic/Imm, Heme/Lymph Last edited by Annie Paras, COT on 08/23/2022  9:56 AM.      ALLERGIES No Known Allergies  PAST MEDICAL HISTORY History reviewed. No pertinent past medical history. History reviewed. No pertinent surgical history.  FAMILY HISTORY Family History  Problem Relation Age of Onset   Amblyopia Neg Hx    Blindness Neg Hx    Cancer Neg Hx    Cataracts Neg Hx    Diabetes Neg Hx    Glaucoma Neg Hx     Hypertension Neg Hx    Macular degeneration Neg Hx    Retinal detachment Neg Hx    Strabismus Neg Hx    Stroke Neg Hx    Thyroid disease Neg Hx    Retinitis pigmentosa Neg Hx    SOCIAL HISTORY Social History   Tobacco Use   Smoking status: Never   Smokeless tobacco: Never  Vaping Use   Vaping Use: Never used  Substance Use Topics   Alcohol use: Not Currently       OPHTHALMIC EXAM: Base Eye Exam     Visual Acuity (Snellen - Linear)       Right Left   Dist cc 20/40 +2 20/20   Dist ph cc 20/30     Correction: Glasses         Tonometry (Tonopen, 10:01 AM)       Right Left   Pressure 18 19         Pupils       Dark Light Shape React APD   Right 4 3 Round Brisk None   Left 4 3 Round Brisk None         Visual Fields       Left Right    Full Full         Extraocular Movement       Right Left    Full, Ortho Full, Ortho  Neuro/Psych     Oriented x3: Yes   Mood/Affect: Normal         Dilation     Both eyes: 1.0% Mydriacyl, 2.5% Phenylephrine @ 9:56 AM           Slit Lamp and Fundus Exam     External Exam       Right Left   External Normal Normal         Slit Lamp Exam       Right Left   Lids/Lashes Dermatochalasis - upper lid, Telangiectasia Dermatochalasis - upper lid   Conjunctiva/Sclera White and quiet White and quiet   Cornea arcus, 2-3+ fine Punctate epithelial erosions arcus, 2-3+ fine Punctate epithelial erosions   Anterior Chamber Deep and quiet Deep and quiet   Iris Round and dilated Round and dilated   Lens 2-3+ Nuclear sclerosis with brunescence, 2-3+ Cortical cataract, Posterior subcapsular cataract 2-3+ Nuclear sclerosis with brunescence, 2-3+ Cortical cataract   Anterior Vitreous Vitreous syneresis, Posterior vitreous detachment, mild vitreous condensations Vitreous syneresis, Posterior vitreous detachment, Vitreous condensations         Fundus Exam       Right Left   Disc Pink and Sharp,  Compact, mild tilt, temporal PPA Pink and Sharp, Compact, mild temporal PPA   C/D Ratio 0.3 0.2   Macula Flat, Blunted foveal reflex, ERM with central thickening and mild striae, mild RPE mottling--stable Flat, Blunted foveal reflex, ERM with central cystic changes   Vessels mild attenuation, mild tortuousity mild attenuation, mild tortuousity   Periphery Attached, No heme, No RT/RD, mild Reticular degeneration Attached, operculated hole at 0200 with mild pigment surrounding -- good laser changes surrounding, mild inferior pavingstone, scattered mild reticular degeneration, no new RT/RD           Refraction     Wearing Rx       Sphere Cylinder Axis   Right -4.25 +1.00 005   Left -4.00 +1.50 175    Type: SVL           IMAGING AND PROCEDURES  Imaging and Procedures for 08/23/2022  OCT, Retina - OU - Both Eyes       Right Eye Quality was good. Central Foveal Thickness: 501. Progression has been stable. Findings include no IRF, no SRF, abnormal foveal contour, myopic contour, epiretinal membrane (ERM with mild pucker and central retinal thickening/blunting of foveal contour-- stable from prior).   Left Eye Quality was good. Central Foveal Thickness: 370. Progression has been stable. Findings include no SRF, abnormal foveal contour, epiretinal membrane, intraretinal fluid (ERM w/ central cystic changes and lamellar hole-- stable from prior).   Notes *Images captured and stored on drive  Diagnosis / Impression:  ERM OU OD: ERM with mild pucker and central retinal thickening/blunting of foveal contour-- stable from prior OS: ERM w/ central cystic changes and lamellar hole-- stable from prior  Clinical management:  See below  Abbreviations: NFP - Normal foveal profile. CME - cystoid macular edema. PED - pigment epithelial detachment. IRF - intraretinal fluid. SRF - subretinal fluid. EZ - ellipsoid zone. ERM - epiretinal membrane. ORA - outer retinal atrophy. ORT - outer  retinal tubulation. SRHM - subretinal hyper-reflective material. IRHM - intraretinal hyper-reflective material            ASSESSMENT/PLAN:    ICD-10-CM   1. Epiretinal membrane (ERM) of both eyes  H35.373 OCT, Retina - OU - Both Eyes    2. Lamellar macular hole of left eye  H35.342  3. Retinal hole of left eye  H33.322     4. Combined forms of age-related cataract of both eyes  H25.813      1,2. Epiretinal membrane, both eyes -- stable  - OD w/ central retinal thickening  - OS w/ central cystic changes and lamellar macular hole - BCVA 20/30 OD, 20/20 OS  - asymptomatic, no metamorphopsia - no indication for surgery at this time - continue monitoring - f/u 9 months DFE, OCT  3. Operculated retinal hole, OS   - operculated retinal hole located at 0200 -- no SRF, mild pigment surrounding - s/p laser retinopexy OS (11.02.22) -- excellent laser in place surrounding hole  - no new RT/RD - f/u in 9 months, DFE, OCT  4. Mixed Cataract OU - The symptoms of cataract, surgical options, and treatments and risks were discussed with patient. - discussed diagnosis and progression - monitor  Ophthalmic Meds Ordered this visit:  No orders of the defined types were placed in this encounter.    Return in about 9 months (around 05/25/2023) for f/u retinal hole OS, ERM OU, DFE, OCT.  There are no Patient Instructions on file for this visit.   Explained the diagnoses, plan, and follow up with the patient and they expressed understanding.  Patient expressed understanding of the importance of proper follow up care.   This document serves as a record of services personally performed by Gardiner Sleeper, MD, PhD. It was created on their behalf by Roselee Nova, COMT. The creation of this record is the provider's dictation and/or activities during the visit.  Electronically signed by: Roselee Nova, COMT 08/25/22 1:07 AM  This document serves as a record of services personally performed  by Gardiner Sleeper, MD, PhD. It was created on their behalf by Renaldo Reel, COT an ophthalmic technician. The creation of this record is the provider's dictation and/or activities during the visit.    Electronically signed by:  Renaldo Reel, COT  03.18.24 1:07 AM  Gardiner Sleeper, M.D., Ph.D. Diseases & Surgery of the Retina and Vitreous [facility]  I have reviewed the above documentation for accuracy and completeness, and I agree with the above. Gardiner Sleeper, M.D., Ph.D. 08/25/22 1:09 AM    Abbreviations: M myopia (nearsighted); A astigmatism; H hyperopia (farsighted); P presbyopia; Mrx spectacle prescription;  CTL contact lenses; OD right eye; OS left eye; OU both eyes  XT exotropia; ET esotropia; PEK punctate epithelial keratitis; PEE punctate epithelial erosions; DES dry eye syndrome; MGD meibomian gland dysfunction; ATs artificial tears; PFAT's preservative free artificial tears; [place] nuclear sclerotic cataract; PSC posterior subcapsular cataract; ERM epi-retinal membrane; PVD posterior vitreous detachment; RD retinal detachment; DM diabetes mellitus; DR diabetic retinopathy; NPDR non-proliferative diabetic retinopathy; PDR proliferative diabetic retinopathy; CSME clinically significant macular edema; DME diabetic macular edema; dbh dot blot hemorrhages; CWS cotton wool spot; POAG primary open angle glaucoma; C/D cup-to-disc ratio; HVF humphrey visual field; GVF goldmann visual field; OCT optical coherence tomography; IOP intraocular pressure; BRVO Branch retinal vein occlusion; CRVO central retinal vein occlusion; CRAO central retinal artery occlusion; BRAO branch retinal artery occlusion; RT retinal tear; SB scleral buckle; PPV pars plana vitrectomy; VH Vitreous hemorrhage; PRP panretinal laser photocoagulation; IVK intravitreal kenalog; VMT vitreomacular traction; MH Macular hole;  NVD neovascularization of the disc; NVE neovascularization elsewhere; AREDS  age related eye disease study; ARMD age related macular degeneration; POAG primary open angle glaucoma; EBMD epithelial/anterior basement membrane dystrophy; ACIOL anterior chamber intraocular lens; IOL  intraocular lens; PCIOL posterior chamber intraocular lens; Phaco/IOL phacoemulsification with intraocular lens placement; [place] photorefractive keratectomy; LASIK laser assisted in situ keratomileusis; HTN hypertension; DM diabetes mellitus; COPD chronic obstructive pulmonary disease

## 2022-08-23 ENCOUNTER — Ambulatory Visit (INDEPENDENT_AMBULATORY_CARE_PROVIDER_SITE_OTHER): Payer: Self-pay | Admitting: Ophthalmology

## 2022-08-23 DIAGNOSIS — H35342 Macular cyst, hole, or pseudohole, left eye: Secondary | ICD-10-CM

## 2022-08-23 DIAGNOSIS — H25813 Combined forms of age-related cataract, bilateral: Secondary | ICD-10-CM

## 2022-08-23 DIAGNOSIS — H33322 Round hole, left eye: Secondary | ICD-10-CM

## 2022-08-23 DIAGNOSIS — H35373 Puckering of macula, bilateral: Secondary | ICD-10-CM

## 2022-08-25 ENCOUNTER — Encounter (INDEPENDENT_AMBULATORY_CARE_PROVIDER_SITE_OTHER): Payer: Self-pay | Admitting: Ophthalmology

## 2022-12-03 ENCOUNTER — Other Ambulatory Visit: Payer: Self-pay | Admitting: Adult Health

## 2022-12-03 DIAGNOSIS — R1031 Right lower quadrant pain: Secondary | ICD-10-CM

## 2022-12-03 DIAGNOSIS — R102 Pelvic and perineal pain: Secondary | ICD-10-CM

## 2022-12-08 ENCOUNTER — Ambulatory Visit
Admission: RE | Admit: 2022-12-08 | Discharge: 2022-12-08 | Disposition: A | Discharge status: Home / Self Care | Payer: No Typology Code available for payment source | Source: Ambulatory Visit | Attending: Adult Health | Admitting: Adult Health

## 2022-12-08 DIAGNOSIS — R1031 Right lower quadrant pain: Secondary | ICD-10-CM

## 2022-12-08 DIAGNOSIS — R102 Pelvic and perineal pain: Secondary | ICD-10-CM

## 2022-12-08 MED ORDER — IOPAMIDOL (ISOVUE-300) INJECTION 61%
100.0000 mL | Freq: Once | INTRAVENOUS | Status: AC | PRN
  Administered 2022-12-08: 100 mL via INTRAVENOUS

## 2022-12-15 ENCOUNTER — Other Ambulatory Visit: Payer: No Typology Code available for payment source

## 2022-12-31 ENCOUNTER — Other Ambulatory Visit: Payer: Self-pay | Admitting: Internal Medicine

## 2022-12-31 DIAGNOSIS — Z1231 Encounter for screening mammogram for malignant neoplasm of breast: Secondary | ICD-10-CM

## 2023-01-17 ENCOUNTER — Encounter: Payer: Self-pay | Admitting: Radiology

## 2023-01-17 ENCOUNTER — Ambulatory Visit
Admission: RE | Admit: 2023-01-17 | Discharge: 2023-01-17 | Disposition: A | Discharge status: Home / Self Care | Payer: No Typology Code available for payment source | Source: Ambulatory Visit | Attending: Internal Medicine | Admitting: Internal Medicine

## 2023-01-17 DIAGNOSIS — Z1231 Encounter for screening mammogram for malignant neoplasm of breast: Secondary | ICD-10-CM

## 2023-05-23 ENCOUNTER — Encounter (INDEPENDENT_AMBULATORY_CARE_PROVIDER_SITE_OTHER): Payer: Self-pay | Admitting: Ophthalmology

## 2023-06-08 NOTE — Progress Notes (Signed)
 [facility] - Clinic Note  06/13/2023     CHIEF COMPLAINT Patient presents for Retina Follow Up  HISTORY OF PRESENT ILLNESS: Lynn Gibbs is a 63 y.o. female who presents to the clinic today for:   HPI     Retina Follow Up   Patient presents with  Other.  In both eyes.  This started 9 months ago.  I, the attending physician,  performed the HPI with the patient and updated documentation appropriately.        Comments   Patient here for 9 months retina follow up for ERM OU. Patient states vision doing good. Doing fine. No eye pain.       Last edited by Valdemar Rogue, MD on 06/13/2023 12:17 PM.    Patient feels that the vision is the same, she just saw Dr. Glendia last month for a contact lens renewal  Referring physician: Glendia Simmonds, OD [address] [place],  [place] [address]  HISTORICAL INFORMATION:   Selected notes from the MEDICAL RECORD NUMBER Referred by Dr. Simmonds Glendia for concern of retinal hole OS LEE:  Ocular Hx- PMH-    CURRENT MEDICATIONS: No current outpatient medications on file. (Ophthalmic Drugs)   No current facility-administered medications for this visit. (Ophthalmic Drugs)   No current outpatient medications on file. (Other)   No current facility-administered medications for this visit. (Other)   REVIEW OF SYSTEMS: ROS   Positive for: Eyes Negative for: Constitutional, Gastrointestinal, Neurological, Skin, Genitourinary, Musculoskeletal, HENT, Endocrine, Cardiovascular, Respiratory, Psychiatric, Allergic/Imm, Heme/Lymph Last edited by Orval Asberry RAMAN, COA on 06/13/2023  9:23 AM.     ALLERGIES No Known Allergies  PAST MEDICAL HISTORY History reviewed. No pertinent past medical history. History reviewed. No pertinent surgical history.  FAMILY HISTORY Family History  Problem Relation Age of Onset   Amblyopia Neg Hx    Blindness Neg Hx    Cancer Neg Hx    Cataracts Neg Hx    Diabetes Neg Hx    Glaucoma Neg  Hx    Hypertension Neg Hx    Macular degeneration Neg Hx    Retinal detachment Neg Hx    Strabismus Neg Hx    Stroke Neg Hx    Thyroid disease Neg Hx    Retinitis pigmentosa Neg Hx    SOCIAL HISTORY Social History   Tobacco Use   Smoking status: Never   Smokeless tobacco: Never  Vaping Use   Vaping status: Never Used  Substance Use Topics   Alcohol use: Not Currently       OPHTHALMIC EXAM: Base Eye Exam     Visual Acuity (Snellen - Linear)       Right Left   Dist cc 20/30 -2 20/20 -2    Correction: Glasses         Tonometry (Tonopen, 9:21 AM)       Right Left   Pressure 17 15         Pupils       Dark Light Shape React APD   Right 4 3 Round Brisk None   Left 4 3 Round Brisk None         Visual Fields (Counting fingers)       Left Right    Full Full         Extraocular Movement       Right Left    Full, Ortho Full, Ortho         Neuro/Psych  Oriented x3: Yes   Mood/Affect: Normal         Dilation     Both eyes: 1.0% Mydriacyl, 2.5% Phenylephrine @ 9:21 AM           Slit Lamp and Fundus Exam     External Exam       Right Left   External Normal Normal         Slit Lamp Exam       Right Left   Lids/Lashes Dermatochalasis - upper lid Dermatochalasis - upper lid   Conjunctiva/Sclera White and quiet White and quiet   Cornea arcus, 1-2+ inferior Punctate epithelial erosions arcus, 1+ fine Punctate epithelial erosions   Anterior Chamber Deep and quiet Deep and quiet   Iris Round and dilated Round and dilated   Lens 2-3+ Nuclear sclerosis with brunescence, 2-3+ Cortical cataract, 1+ central posterior subcapsular cataract 2-3+ Nuclear sclerosis with brunescence, 2-3+ Cortical cataract, trace central posterior subcapsular cataract   Anterior Vitreous Vitreous syneresis, Posterior vitreous detachment, mild vitreous condensations Vitreous syneresis, Posterior vitreous detachment, Vitreous condensations         Fundus  Exam       Right Left   Disc Pink and Sharp, Compact, mild tilt, temporal PPA Pink and Sharp, Compact, mild temporal PPA   C/D Ratio 0.3 0.2   Macula Flat, Blunted foveal reflex, ERM with central thickening and mild striae, mild RPE mottling Flat, Blunted foveal reflex, ERM with central cystic changes, no heme   Vessels attenuated, mild tortuosity attenuated, mild tortuosity   Periphery Attached, No heme, No RT/RD, mild Reticular degeneration Attached, operculated hole at 0200 with mild pigment surrounding -- good laser changes surrounding, mild inferior pavingstone, scattered mild reticular degeneration, no new RT/RD           Refraction     Wearing Rx       Sphere Cylinder Axis   Right -4.25 +1.00 005   Left -4.00 +1.50 175    Type: SVL           IMAGING AND PROCEDURES  Imaging and Procedures for 06/13/2023  OCT, Retina - OU - Both Eyes       Right Eye Quality was good. Central Foveal Thickness: 497. Progression has been stable. Findings include no IRF, no SRF, abnormal foveal contour, myopic contour, epiretinal membrane (ERM with mild pucker and central retinal thickening/blunting of foveal contour -- stable from prior).   Left Eye Quality was good. Central Foveal Thickness: 363. Progression has been stable. Findings include no SRF, abnormal foveal contour, epiretinal membrane, intraretinal fluid (ERM w/ central cystic changes and lamellar hole-- stable from prior).   Notes *Images captured and stored on drive  Diagnosis / Impression:  ERM OU OD: ERM with mild pucker and central retinal thickening/blunting of foveal contour-- stable from prior OS: ERM w/ central cystic changes and lamellar hole-- stable from prior  Clinical management:  See below  Abbreviations: NFP - Normal foveal profile. CME - cystoid macular edema. PED - pigment epithelial detachment. IRF - intraretinal fluid. SRF - subretinal fluid. EZ - ellipsoid zone. ERM - epiretinal membrane. ORA - outer  retinal atrophy. ORT - outer retinal tubulation. SRHM - subretinal hyper-reflective material. IRHM - intraretinal hyper-reflective material            ASSESSMENT/PLAN:    ICD-10-CM   1. Epiretinal membrane (ERM) of both eyes  H35.373 OCT, Retina - OU - Both Eyes    2. Lamellar macular hole of left eye  H35.342     3. Retinal hole of left eye  H33.322     4. Combined forms of age-related cataract of both eyes  H25.813      1,2. Epiretinal membrane, both eyes -- stable  - OD w/ central retinal thickening  - OS w/ central cystic changes and lamellar macular hole - BCVA 20/30 OD, 20/20 OS -- stable - asymptomatic, no metamorphopsia - no indication for surgery at this time - continue monitoring - f/u 9 months DFE, OCT  3. Operculated retinal hole, OS   - operculated retinal hole located at 0200 -- no SRF, mild pigment surrounding - s/p laser retinopexy OS (11.02.22) -- excellent laser in place surrounding hole  - no new RT/RD - f/u in 9 months, DFE, OCT  4. Mixed Cataract OU - The symptoms of cataract, surgical options, and treatments and risks were discussed with patient. - discussed diagnosis and progression - pt reports some glare symptoms with some affect on night-time driving - BCVA remains sharp: OD 20/30; OS 20/20 - monitor  Ophthalmic Meds Ordered this visit:  No orders of the defined types were placed in this encounter.    Return in about 9 months (around 03/12/2024) for f/u ERM OU, DFE, OCT.  There are no Patient Instructions on file for this visit.   Explained the diagnoses, plan, and follow up with the patient and they expressed understanding.  Patient expressed understanding of the importance of proper follow up care.   This document serves as a record of services personally performed by Redell JUDITHANN Hans, MD, PhD. It was created on their behalf by Alan PARAS. Delores, OA an ophthalmic technician. The creation of this record is the provider's dictation and/or  activities during the visit.    Electronically signed by: Alan PARAS. Delores, OA 06/13/23 12:21 PM   Redell JUDITHANN Hans, M.D., Ph.D. Diseases & Surgery of the Retina and Vitreous [facility]  I have reviewed the above documentation for accuracy and completeness, and I agree with the above. Redell JUDITHANN Hans, M.D., Ph.D. 06/13/23 12:23 PM  Abbreviations: M myopia (nearsighted); A astigmatism; H hyperopia (farsighted); P presbyopia; Mrx spectacle prescription;  CTL contact lenses; OD right eye; OS left eye; OU both eyes  XT exotropia; ET esotropia; PEK punctate epithelial keratitis; PEE punctate epithelial erosions; DES dry eye syndrome; MGD meibomian gland dysfunction; ATs artificial tears; PFAT's preservative free artificial tears; [place] nuclear sclerotic cataract; PSC posterior subcapsular cataract; ERM epi-retinal membrane; PVD posterior vitreous detachment; RD retinal detachment; DM diabetes mellitus; DR diabetic retinopathy; NPDR non-proliferative diabetic retinopathy; PDR proliferative diabetic retinopathy; CSME clinically significant macular edema; DME diabetic macular edema; dbh dot blot hemorrhages; CWS cotton wool spot; POAG primary open angle glaucoma; C/D cup-to-disc ratio; HVF humphrey visual field; GVF goldmann visual field; OCT optical coherence tomography; IOP intraocular pressure; BRVO Branch retinal vein occlusion; CRVO central retinal vein occlusion; CRAO central retinal artery occlusion; BRAO branch retinal artery occlusion; RT retinal tear; SB scleral buckle; PPV pars plana vitrectomy; VH Vitreous hemorrhage; PRP panretinal laser photocoagulation; IVK intravitreal kenalog; VMT vitreomacular traction; MH Macular hole;  NVD neovascularization of the disc; NVE neovascularization elsewhere; AREDS age related eye disease study; ARMD age related macular degeneration; POAG primary open angle glaucoma; EBMD epithelial/anterior basement membrane dystrophy; ACIOL anterior chamber  intraocular lens; IOL intraocular lens; PCIOL posterior chamber intraocular lens; Phaco/IOL phacoemulsification with intraocular lens placement; PRK photorefractive keratectomy; LASIK laser assisted in situ keratomileusis; HTN hypertension; DM diabetes mellitus; COPD chronic  obstructive pulmonary disease

## 2023-06-13 ENCOUNTER — Ambulatory Visit (INDEPENDENT_AMBULATORY_CARE_PROVIDER_SITE_OTHER): Payer: Self-pay | Admitting: Ophthalmology

## 2023-06-13 ENCOUNTER — Encounter (INDEPENDENT_AMBULATORY_CARE_PROVIDER_SITE_OTHER): Payer: Self-pay | Admitting: Ophthalmology

## 2023-06-13 DIAGNOSIS — H25813 Combined forms of age-related cataract, bilateral: Secondary | ICD-10-CM

## 2023-06-13 DIAGNOSIS — H35373 Puckering of macula, bilateral: Secondary | ICD-10-CM

## 2023-06-13 DIAGNOSIS — H33322 Round hole, left eye: Secondary | ICD-10-CM

## 2023-06-13 DIAGNOSIS — H35342 Macular cyst, hole, or pseudohole, left eye: Secondary | ICD-10-CM

## 2023-11-23 IMAGING — MG MM DIGITAL SCREENING BILAT W/ TOMO AND CAD
8 series · 9 of 24 positions shown · non-contrast
Comparison: Previous exam(s).

CLINICAL DATA: Screening.

EXAM:
DIGITAL SCREENING BILATERAL MAMMOGRAM WITH TOMOSYNTHESIS AND CAD
TECHNIQUE: Bilateral screening digital craniocaudal and mediolateral oblique
mammograms were obtained. Bilateral screening digital breast
tomosynthesis was performed. The images were evaluated with
computer-aided detection.

[L MLO synth-2D]
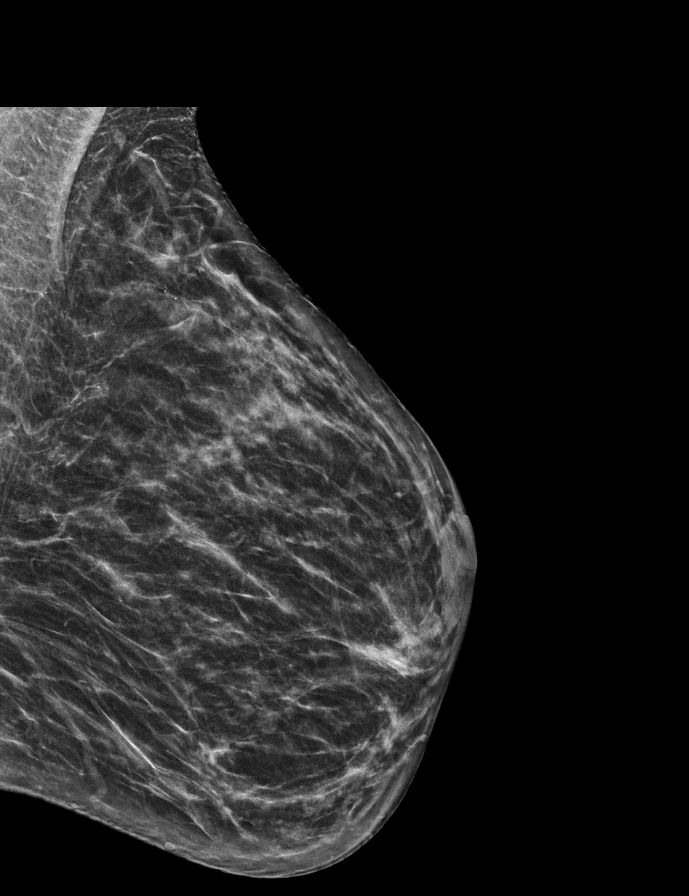

[L CC synth-2D]
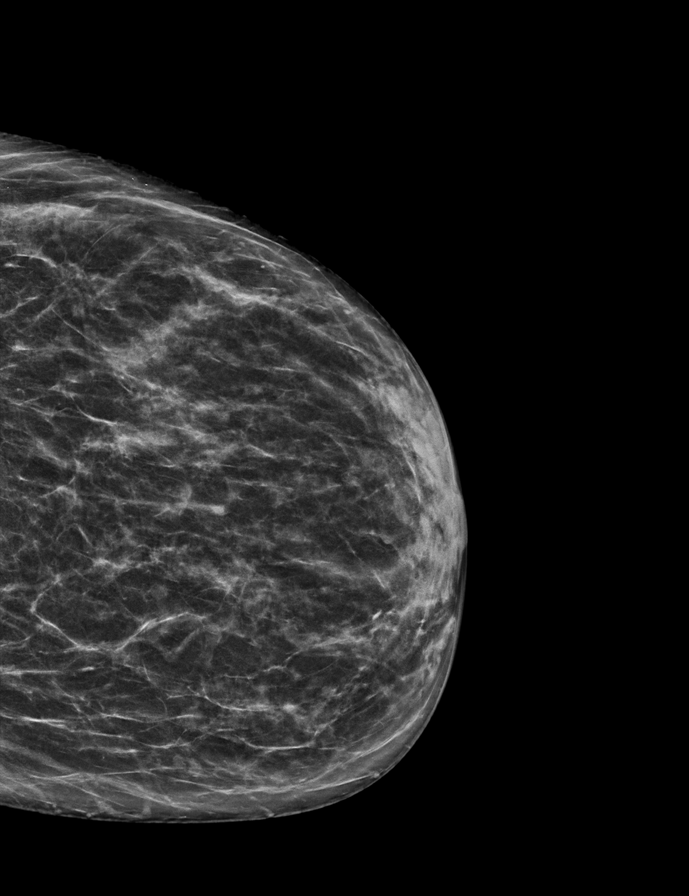

[R CC synth-2D]
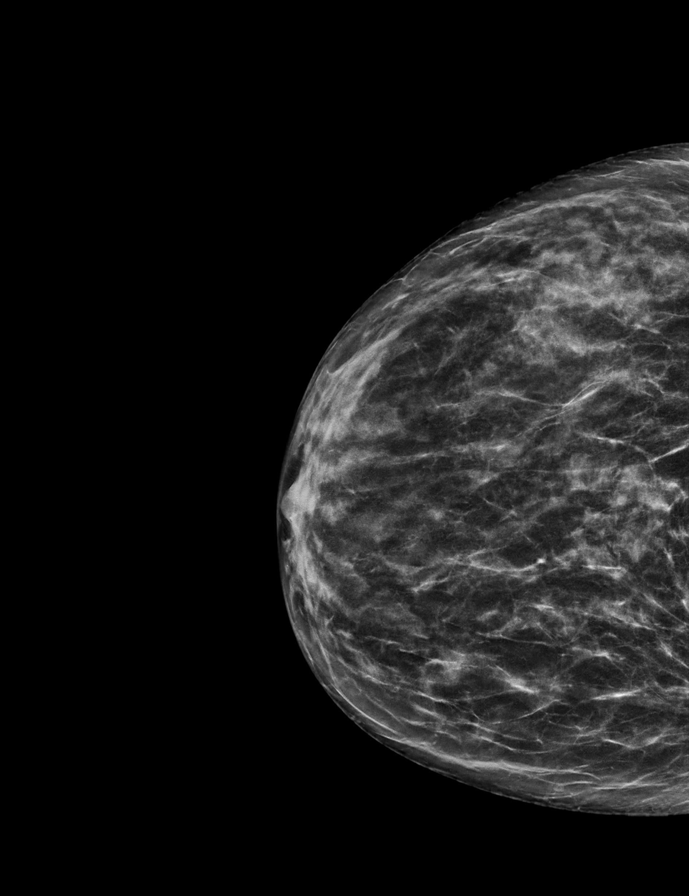

[R MLO synth-2D]
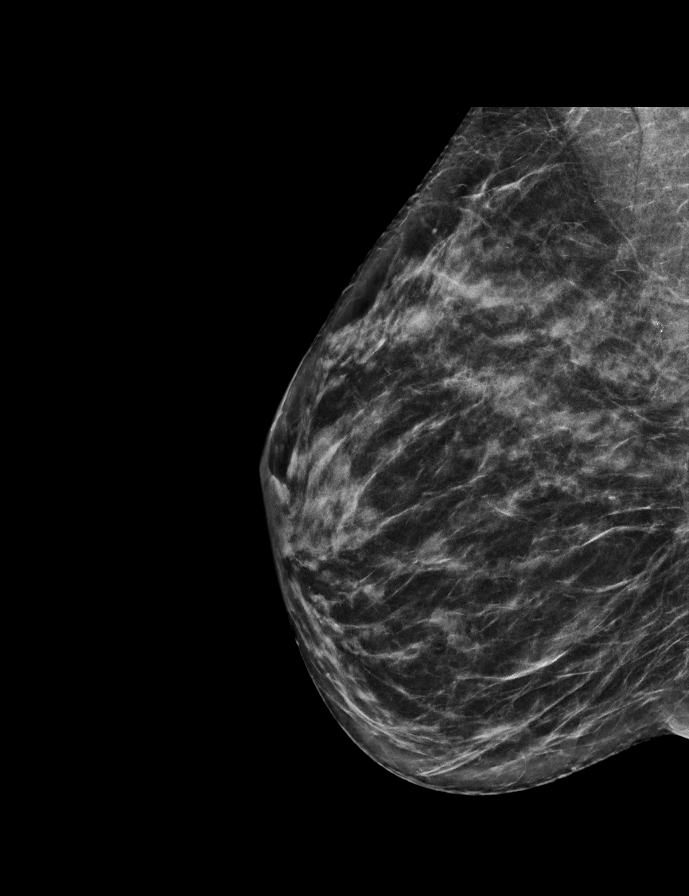

[L CC tomo · 2 of 61 frames shown]
[frame 20/61]
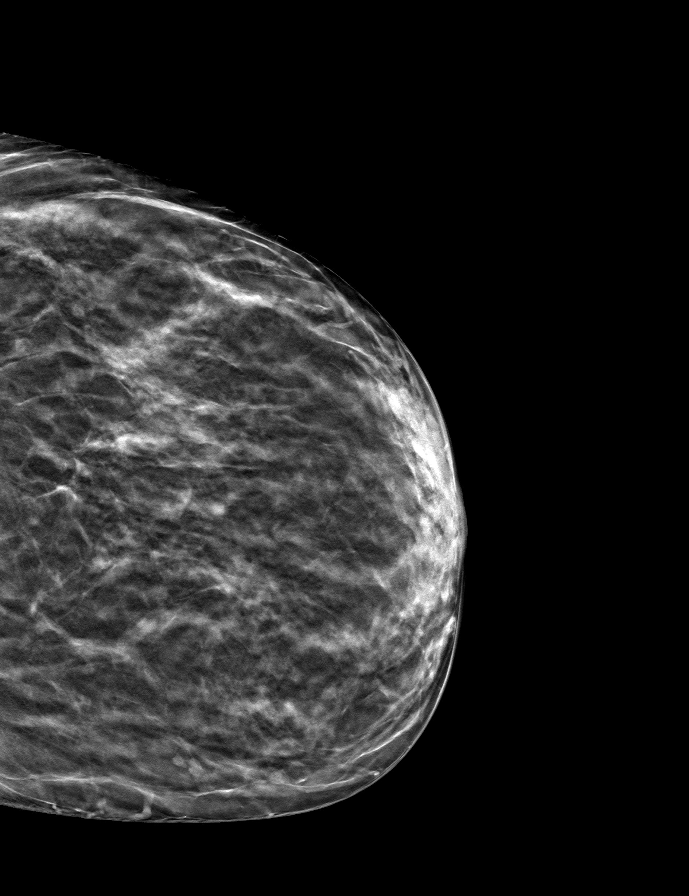
[frame 31/61]
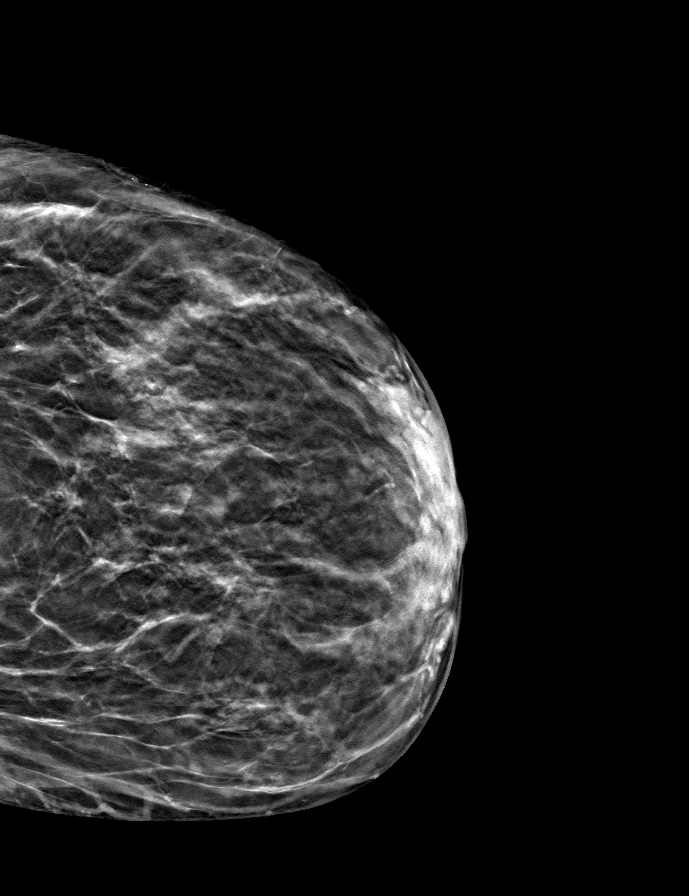

[R CC tomo · tomo slice 30/59.0]
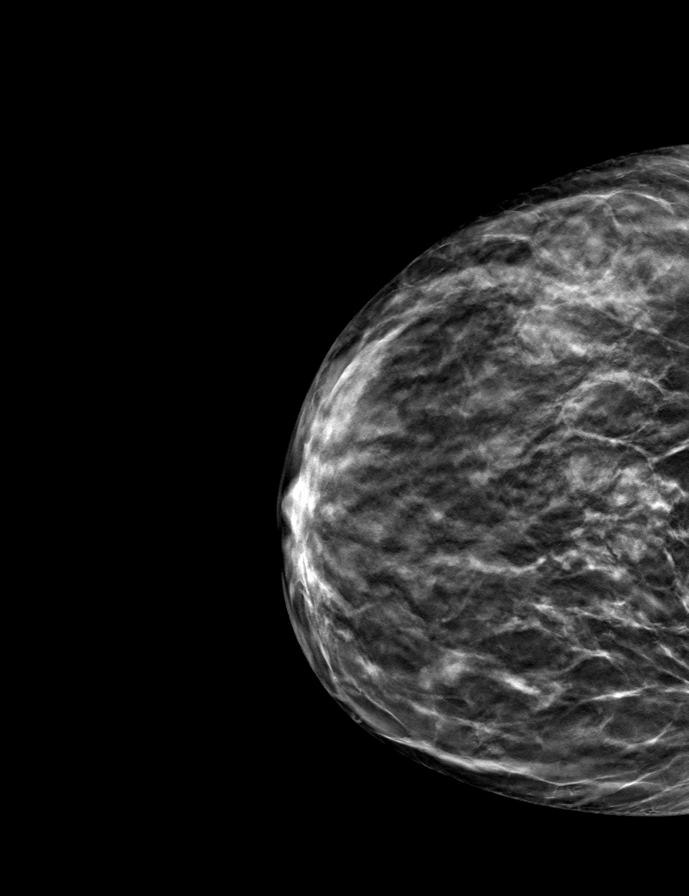

[L MLO tomo · tomo slice 31/60.0]
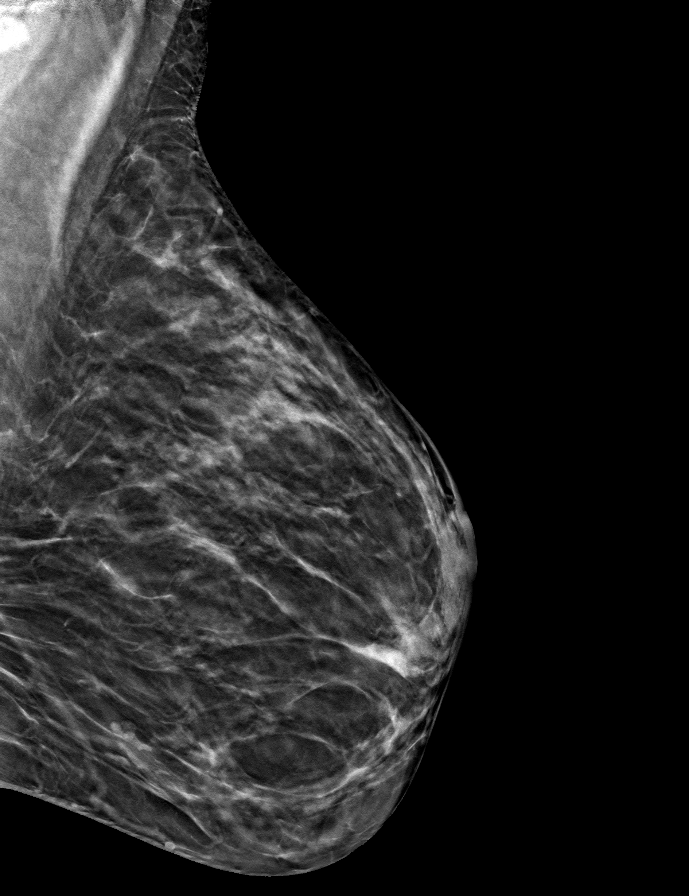

[R MLO tomo · tomo slice 31/60.0]
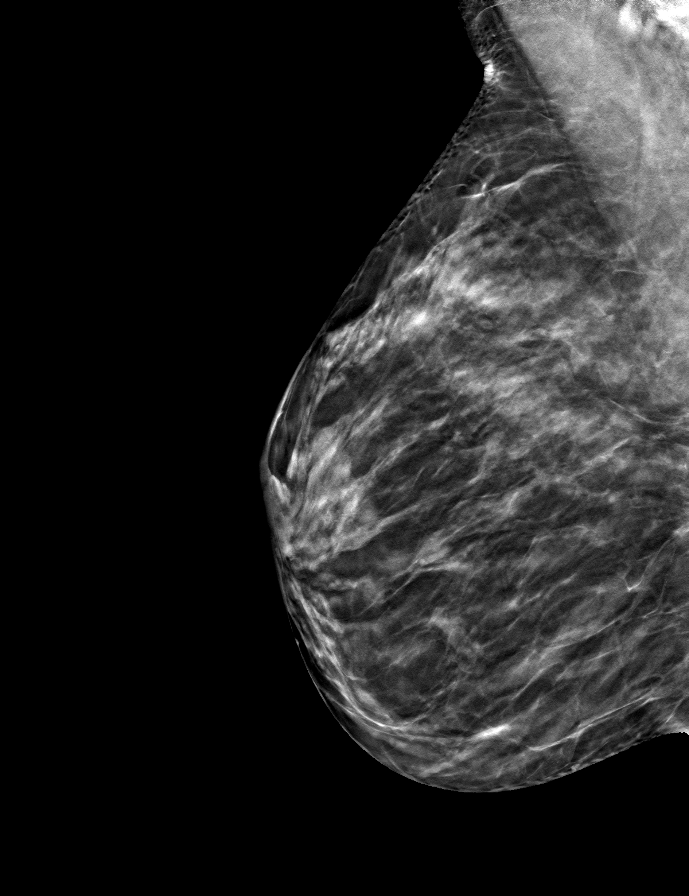

[9 of 24 positions shown; findings below may reference images not displayed]

ACR Breast Density Category c: The breast tissue is heterogeneously
dense, which may obscure small masses.
FINDINGS: There are no findings suspicious for malignancy.
IMPRESSION: No mammographic evidence of malignancy. A result letter of this
screening mammogram will be mailed directly to the patient.

RECOMMENDATION:
Screening mammogram in one year. (Code:Q3-W-BC3)

BI-RADS CATEGORY  1: Negative.

## 2023-12-30 ENCOUNTER — Other Ambulatory Visit: Payer: Self-pay | Admitting: Internal Medicine

## 2023-12-30 DIAGNOSIS — Z1231 Encounter for screening mammogram for malignant neoplasm of breast: Secondary | ICD-10-CM

## 2024-01-20 ENCOUNTER — Ambulatory Visit
Admission: RE | Admit: 2024-01-20 | Discharge: 2024-01-20 | Disposition: A | Discharge status: Home / Self Care | Source: Ambulatory Visit | Attending: Internal Medicine | Admitting: Internal Medicine

## 2024-01-20 DIAGNOSIS — Z1231 Encounter for screening mammogram for malignant neoplasm of breast: Secondary | ICD-10-CM

## 2024-01-24 ENCOUNTER — Other Ambulatory Visit: Payer: Self-pay | Admitting: Internal Medicine

## 2024-01-24 DIAGNOSIS — R1011 Right upper quadrant pain: Secondary | ICD-10-CM

## 2024-01-25 ENCOUNTER — Inpatient Hospital Stay
Admission: RE | Admit: 2024-01-25 | Discharge: 2024-01-25 | Discharge status: Home / Self Care | Source: Ambulatory Visit | Attending: Internal Medicine

## 2024-01-25 DIAGNOSIS — R1011 Right upper quadrant pain: Secondary | ICD-10-CM

## 2024-03-05 NOTE — Progress Notes (Signed)
 [facility] - Clinic Note  03/12/2024     CHIEF COMPLAINT Patient presents for Retina Follow Up  HISTORY OF PRESENT ILLNESS: Lynn Gibbs is a 63 y.o. female who presents to the clinic today for:   HPI     Retina Follow Up   Diagnosis: ERM.  In both eyes.  This started 3 years ago.  Severity is moderate.  Duration of 10 months.  I, the attending physician,  performed the HPI with the patient and updated documentation appropriately.        Comments   Pt denies any changes in vision/no FOL/floaters/pain. Pt states Friday night her OD started to feel a little itchy, slightly irritated.       Last edited by Valdemar Rogue, MD on 03/16/2024  1:42 AM.     Patient states the right eye is a little irritated. She is not using eye drops.   Referring physician: Glendia Simmonds, OD [address] [place],  [place] [id number]  HISTORICAL INFORMATION:   Selected notes from the MEDICAL RECORD NUMBER Referred by Dr. Simmonds Glendia for concern of retinal hole OS LEE:  Ocular Hx- PMH-    CURRENT MEDICATIONS: No current outpatient medications on file. (Ophthalmic Drugs)   No current facility-administered medications for this visit. (Ophthalmic Drugs)   Current Outpatient Medications (Other)  Medication Sig   calcium carbonate (SUPER CALCIUM) 1500 (600 Ca) MG TABS tablet 1 tablet with meals Orally Twice a day   cholecalciferol (VITAMIN D3) 25 MCG (1000 UNIT) tablet 1 capsule.   No current facility-administered medications for this visit. (Other)   REVIEW OF SYSTEMS: ROS   Positive for: Eyes Negative for: Constitutional, Gastrointestinal, Neurological, Skin, Genitourinary, Musculoskeletal, HENT, Endocrine, Cardiovascular, Respiratory, Psychiatric, Allergic/Imm, Heme/Lymph Last edited by Elnor Avelina RAMAN, COT on 03/12/2024  9:21 AM.      ALLERGIES No Known Allergies  PAST MEDICAL HISTORY History reviewed. No pertinent past medical history. History  reviewed. No pertinent surgical history.  FAMILY HISTORY Family History  Problem Relation Age of Onset   Amblyopia Neg Hx    Blindness Neg Hx    Cancer Neg Hx    Cataracts Neg Hx    Diabetes Neg Hx    Glaucoma Neg Hx    Hypertension Neg Hx    Macular degeneration Neg Hx    Retinal detachment Neg Hx    Strabismus Neg Hx    Stroke Neg Hx    Thyroid disease Neg Hx    Retinitis pigmentosa Neg Hx    SOCIAL HISTORY Social History   Tobacco Use   Smoking status: Never   Smokeless tobacco: Never  Vaping Use   Vaping status: Never Used  Substance Use Topics   Alcohol use: Not Currently       OPHTHALMIC EXAM: Base Eye Exam     Visual Acuity (Snellen - Linear)       Right Left   Dist cc 20/40 -1 20/20 -2   Dist ph cc 20/20 -1 20/20 -1    Correction: Glasses         Tonometry (Tonopen, 9:35 AM)       Right Left   Pressure 17 19         Pupils       Pupils Dark Light Shape React APD   Right PERRL 4 3 Round Brisk None   Left PERRL 4 3 Round Brisk None         Visual Fields  Left Right    Full Full         Extraocular Movement       Right Left    Full, Ortho Full, Ortho         Neuro/Psych     Oriented x3: Yes   Mood/Affect: Normal         Dilation     Both eyes: 1.0% Mydriacyl, 2.5% Phenylephrine @ 9:36 AM           Slit Lamp and Fundus Exam     External Exam       Right Left   External Normal Normal         Slit Lamp Exam       Right Left   Lids/Lashes Dermatochalasis - upper lid Dermatochalasis - upper lid   Conjunctiva/Sclera White and quiet White and quiet   Cornea arcus, 1-2+ inferior PEE, punctate corneal haze ST paracentral arcus, 1+ fine Punctate epithelial erosions   Anterior Chamber Deep and quiet Deep and quiet   Iris Round and dilated Round and dilated   Lens 2-3+ Nuclear sclerosis with brunescence, 2-3+ Cortical cataract, 1+ central posterior subcapsular cataract 2-3+ Nuclear sclerosis with  brunescence, 2-3+ Cortical cataract, trace central posterior subcapsular cataract   Anterior Vitreous Vitreous syneresis, Posterior vitreous detachment, mild vitreous condensations Vitreous syneresis, Posterior vitreous detachment, Vitreous condensations         Fundus Exam       Right Left   Disc Pink and Sharp, Compact, mild tilt, temporal PPA Pink and Sharp, Compact, mild temporal PPA   C/D Ratio 0.3 0.2   Macula Flat, Blunted foveal reflex, ERM with central thickening and mild striae, mild RPE mottling Flat, Blunted foveal reflex, ERM with central cystic changes, no heme   Vessels attenuated, mild tortuosity attenuated, mild tortuosity   Periphery Attached, No heme, No RT/RD, mild Reticular degeneration Attached, operculated hole at 0200 with mild pigment surrounding -- good laser changes surrounding, mild inferior pavingstone, scattered mild reticular degeneration, no new RT/RD           Refraction     Wearing Rx       Sphere Cylinder Axis   Right -4.25 +1.00 005   Left -4.00 +1.50 175    Type: SVL           IMAGING AND PROCEDURES  Imaging and Procedures for 03/12/2024  OCT, Retina - OU - Both Eyes       Right Eye Quality was good. Central Foveal Thickness: 491. Progression has been stable. Findings include no IRF, no SRF, abnormal foveal contour, myopic contour, epiretinal membrane (ERM with mild pucker and central retinal thickening/blunting of foveal contour -- stable from prior).   Left Eye Quality was good. Central Foveal Thickness: 356. Progression has been stable. Findings include no SRF, abnormal foveal contour, epiretinal membrane, intraretinal fluid (ERM w/ central cystic changes and lamellar hole-- stable from prior).   Notes *Images captured and stored on drive  Diagnosis / Impression:  ERM OU OD: ERM with mild pucker and central retinal thickening/blunting of foveal contour-- stable from prior OS: ERM w/ central cystic changes and lamellar hole--  stable from prior  Clinical management:  See below  Abbreviations: NFP - Normal foveal profile. CME - cystoid macular edema. PED - pigment epithelial detachment. IRF - intraretinal fluid. SRF - subretinal fluid. EZ - ellipsoid zone. ERM - epiretinal membrane. ORA - outer retinal atrophy. ORT - outer retinal tubulation. SRHM - subretinal hyper-reflective material. IRHM -  intraretinal hyper-reflective material            ASSESSMENT/PLAN:    ICD-10-CM   1. Epiretinal membrane (ERM) of both eyes  H35.373 OCT, Retina - OU - Both Eyes    2. Lamellar macular hole of left eye  H35.342     3. Retinal hole of left eye  H33.322     4. Combined forms of age-related cataract of both eyes  H25.813      1,2. Epiretinal membrane, both eyes -- stable  - OD w/ central retinal thickening  - OS w/ central cystic changes and lamellar macular hole - BCVA OD 20/20 from 20/30 OD, 20/20 OS -- stable - asymptomatic, no metamorphopsia - no indication for surgery at this time - continue monitoring - f/u in 1 year, sooner prn -- DFE, OCT  3. Operculated retinal hole, OS   - operculated retinal hole located at 0200 -- no SRF, mild pigment surrounding - s/p laser retinopexy OS (11.02.22) -- excellent laser in place surrounding hole  - no new RT/RD - f/u in 1 yr, DFE, OCT  4. Mixed Cataract OU - The symptoms of cataract, surgical options, and treatments and risks were discussed with patient. - discussed diagnosis and progression - pt reports some glare symptoms with some affect on night-time driving - BCVA remains sharp: OD 20/20 from 20/30; OS 20/20 - monitor  Ophthalmic Meds Ordered this visit:  No orders of the defined types were placed in this encounter.    Return in about 1 year (around 03/12/2025) for f/u ERM OU, Dilated Exam, OCT.  There are no Patient Instructions on file for this visit.   Explained the diagnoses, plan, and follow up with the patient and they expressed understanding.   Patient expressed understanding of the importance of proper follow up care.    This document serves as a record of services personally performed by Redell JUDITHANN Hans, MD, PhD. It was created on their behalf by Avelina Pereyra, COA an ophthalmic technician. The creation of this record is the provider's dictation and/or activities during the visit.   Electronically signed by: Avelina GORMAN Pereyra, COT  03/16/24  1:42 AM   This document serves as a record of services personally performed by Redell JUDITHANN Hans, MD, PhD. It was created on their behalf by Wanda GEANNIE Keens, COT an ophthalmic technician. The creation of this record is the provider's dictation and/or activities during the visit.    Electronically signed by:  Wanda GEANNIE Keens, COT  03/16/24 1:42 AM  Redell JUDITHANN Hans, M.D., Ph.D. Diseases & Surgery of the Retina and Vitreous [facility]  I have reviewed the above documentation for accuracy and completeness, and I agree with the above. Redell JUDITHANN Hans, M.D., Ph.D. 03/16/24 1:44 AM   Abbreviations: M myopia (nearsighted); A astigmatism; H hyperopia (farsighted); P presbyopia; Mrx spectacle prescription;  CTL contact lenses; OD right eye; OS left eye; OU both eyes  XT exotropia; ET esotropia; PEK punctate epithelial keratitis; PEE punctate epithelial erosions; DES dry eye syndrome; MGD meibomian gland dysfunction; ATs artificial tears; PFAT's preservative free artificial tears; NSC nuclear sclerotic cataract; PSC posterior subcapsular cataract; ERM epi-retinal membrane; PVD posterior vitreous detachment; RD retinal detachment; DM diabetes mellitus; DR diabetic retinopathy; NPDR non-proliferative diabetic retinopathy; PDR proliferative diabetic retinopathy; CSME clinically significant macular edema; DME diabetic macular edema; dbh dot blot hemorrhages; CWS cotton wool spot; POAG primary open angle glaucoma; C/D cup-to-disc ratio; HVF humphrey visual field; GVF goldmann visual  field; OCT optical coherence tomography; IOP intraocular pressure; BRVO Branch retinal vein occlusion; CRVO central retinal vein occlusion; CRAO central retinal artery occlusion; BRAO branch retinal artery occlusion; RT retinal tear; SB scleral buckle; PPV pars plana vitrectomy; VH Vitreous hemorrhage; PRP panretinal laser photocoagulation; IVK intravitreal kenalog; VMT vitreomacular traction; MH Macular hole;  NVD neovascularization of the disc; NVE neovascularization elsewhere; AREDS age related eye disease study; ARMD age related macular degeneration; POAG primary open angle glaucoma; EBMD epithelial/anterior basement membrane dystrophy; ACIOL anterior chamber intraocular lens; IOL intraocular lens; PCIOL posterior chamber intraocular lens; Phaco/IOL phacoemulsification with intraocular lens placement; PRK photorefractive keratectomy; LASIK laser assisted in situ keratomileusis; HTN hypertension; DM diabetes mellitus; COPD chronic obstructive pulmonary disease

## 2024-03-12 ENCOUNTER — Ambulatory Visit (INDEPENDENT_AMBULATORY_CARE_PROVIDER_SITE_OTHER): Payer: Self-pay | Admitting: Ophthalmology

## 2024-03-12 ENCOUNTER — Encounter (INDEPENDENT_AMBULATORY_CARE_PROVIDER_SITE_OTHER): Payer: Self-pay | Admitting: Ophthalmology

## 2024-03-12 DIAGNOSIS — H35342 Macular cyst, hole, or pseudohole, left eye: Secondary | ICD-10-CM | POA: Diagnosis not present

## 2024-03-12 DIAGNOSIS — H35373 Puckering of macula, bilateral: Secondary | ICD-10-CM | POA: Diagnosis not present

## 2024-03-12 DIAGNOSIS — H25813 Combined forms of age-related cataract, bilateral: Secondary | ICD-10-CM

## 2024-03-12 DIAGNOSIS — H33322 Round hole, left eye: Secondary | ICD-10-CM | POA: Diagnosis not present

## 2024-03-16 ENCOUNTER — Encounter (INDEPENDENT_AMBULATORY_CARE_PROVIDER_SITE_OTHER): Payer: Self-pay | Admitting: Ophthalmology

## 2025-03-11 ENCOUNTER — Encounter (INDEPENDENT_AMBULATORY_CARE_PROVIDER_SITE_OTHER): Admitting: Ophthalmology
